# Patient Record
Sex: Female | Born: 1950 | Race: White | Hispanic: No | Marital: Married | Smoking: Never smoker
Health system: Southern US, Community
[De-identification: ages and names within clinical notes are randomized; demographics above are authoritative.]

## PROBLEM LIST (undated history)

## (undated) DIAGNOSIS — I10 Essential (primary) hypertension: Secondary | ICD-10-CM

## (undated) DIAGNOSIS — M719 Bursopathy, unspecified: Secondary | ICD-10-CM

## (undated) HISTORY — PX: BREAST ENHANCEMENT SURGERY: SHX7

## (undated) HISTORY — PX: GANGLION CYST EXCISION: SHX1691

## (undated) HISTORY — DX: Bursopathy, unspecified: M71.9

## (undated) HISTORY — DX: Essential (primary) hypertension: I10

## (undated) HISTORY — PX: TUBAL LIGATION: SHX77

## (undated) HISTORY — PX: LASIK: SHX215

## (undated) HISTORY — PX: OTHER SURGICAL HISTORY: SHX169

## (undated) HISTORY — PX: BREAST IMPLANT REMOVAL: SUR1101

---

## 1999-04-25 ENCOUNTER — Encounter: Admission: RE | Admit: 1999-04-25 | Discharge: 1999-04-25 | Payer: Self-pay | Admitting: Obstetrics and Gynecology

## 1999-04-25 ENCOUNTER — Encounter: Payer: Self-pay | Admitting: Obstetrics and Gynecology

## 2000-12-11 ENCOUNTER — Encounter: Admission: RE | Admit: 2000-12-11 | Discharge: 2000-12-11 | Payer: Self-pay | Admitting: Family Medicine

## 2000-12-11 ENCOUNTER — Encounter: Payer: Self-pay | Admitting: Family Medicine

## 2001-12-16 ENCOUNTER — Encounter: Admission: RE | Admit: 2001-12-16 | Discharge: 2001-12-16 | Payer: Self-pay | Admitting: Family Medicine

## 2001-12-16 ENCOUNTER — Encounter: Payer: Self-pay | Admitting: Family Medicine

## 2003-01-14 ENCOUNTER — Encounter: Payer: Self-pay | Admitting: Family Medicine

## 2003-01-14 ENCOUNTER — Encounter: Admission: RE | Admit: 2003-01-14 | Discharge: 2003-01-14 | Payer: Self-pay | Admitting: Family Medicine

## 2004-08-09 ENCOUNTER — Other Ambulatory Visit: Admission: RE | Admit: 2004-08-09 | Discharge: 2004-08-09 | Payer: Self-pay | Admitting: Dermatology

## 2010-07-22 ENCOUNTER — Encounter: Payer: Self-pay | Admitting: Family Medicine

## 2010-10-11 ENCOUNTER — Encounter (HOSPITAL_BASED_OUTPATIENT_CLINIC_OR_DEPARTMENT_OTHER): Payer: BC Managed Care – PPO | Admitting: Internal Medicine

## 2010-10-11 ENCOUNTER — Ambulatory Visit (HOSPITAL_COMMUNITY)
Admission: RE | Admit: 2010-10-11 | Discharge: 2010-10-11 | Disposition: A | Discharge status: Home / Self Care | Payer: BC Managed Care – PPO | Source: Ambulatory Visit | Attending: Internal Medicine | Admitting: Internal Medicine

## 2010-10-11 DIAGNOSIS — K644 Residual hemorrhoidal skin tags: Secondary | ICD-10-CM

## 2010-10-11 DIAGNOSIS — Z79899 Other long term (current) drug therapy: Secondary | ICD-10-CM | POA: Insufficient documentation

## 2010-10-11 DIAGNOSIS — Z1211 Encounter for screening for malignant neoplasm of colon: Secondary | ICD-10-CM | POA: Insufficient documentation

## 2010-10-11 DIAGNOSIS — I10 Essential (primary) hypertension: Secondary | ICD-10-CM | POA: Insufficient documentation

## 2010-10-11 DIAGNOSIS — Z800 Family history of malignant neoplasm of digestive organs: Secondary | ICD-10-CM

## 2010-11-05 NOTE — Op Note (Signed)
  NAMEJAYLEIGH, Hailey Kelly               ACCOUNT NO.:  [id number]  MEDICAL RECORD NO.:  [id number]           PATIENT TYPE:  O  LOCATION:  DAYP                          FACILITY:  [facility]  PHYSICIAN:  Lionel December, M.D.    DATE OF BIRTH:  05/14/1951  DATE OF PROCEDURE:  10/11/2010 DATE OF DISCHARGE:                              OPERATIVE REPORT   PROCEDURE:  Colonoscopy.  SURGEON:  Lionel December, MD  INDICATIONS:  Chrisa is a 60 year old Caucasian female who is undergoing average risk screening colonoscopy.  Family history is positive for colon carcinoma but only in one second degree relative, her paternal uncle, who was possibly older than 52.  Procedure risks were reviewed with the patient and informed consent was obtained.  MEDICATIONS FOR CONSCIOUS SEDATION:  Demerol 50 mg IV, Versed 5 mg IV.  FINDINGS:  Procedure performed in endoscopy suite.  The patient's vital signs and O2 sats were monitored during the procedure and remained stable.  The patient was placed in left lateral recumbent position. Rectal examination performed.  No abnormality noted on external or digital exam.  Pentax videoscope was placed through the rectum and advanced under vision into sigmoid colon beyond.  She had some formed stool in the proximal sigmoid colon but prep was otherwise excellent. Scope was passed into cecum which was identified by appendiceal orifice and ileocecal valve.  Pictures were taken for the record.  As the scope was withdrawn, colonic mucosa was carefully examined and no abnormalities were noted.  Rectal mucosa similarly was normal.  Scope was retroflexed to examine.  Anorectal junction and small hemorrhoids noted below the dentate line.  Endoscope was withdrawn.  Withdrawal time was 10 minutes.  The patient tolerated the procedure well.  FINAL DIAGNOSES: 1. Examination performed to cecum. 2. Small external hemorrhoids, otherwise normal colonoscopy.  RECOMMENDATIONS: 1. Standard  instructions given. 2. Yearly Hemoccults. 3. Consider next screening exam in 10 years.     Lionel December, M.D.     NR/MEDQ  D:  10/11/2010  T:  10/11/2010  Job:  [id number]  cc:   Peyton Najjar, MD  Electronically Signed by Lionel December M.D. on 11/05/2010 12:07:13 PM

## 2017-05-16 ENCOUNTER — Encounter: Payer: Self-pay | Admitting: Neurology

## 2017-05-28 ENCOUNTER — Encounter: Payer: Self-pay | Admitting: Neurology

## 2017-06-03 NOTE — Progress Notes (Signed)
Lawrence MarseillesDonna E Verrill was seen today in the movement disorders clinic for neurologic consultation at the request of Kirstie PeriShah, Ashish, MD.  This patient is accompanied in the office by her husband who supplements the history.  The consultation is for the evaluation of resting tremor on the L.  Started a few weeks ago per PCP notes that are reviewed.  Pt reports that it started in 2015.  Specific Symptoms:  Tremor: Yes.  , L hand, started in thumb in 2015 and then in 2016 involved then hand.  This year started in the lip.  Mostly at rest but sometimes when she holds something.   Nothing in the leg and nothing on the R side Family hx of similar:  No. Voice: no change Sleep: trouble getting and staying asleep  Vivid Dreams:  Yes.    Acting out dreams:  Yes.   - yells out - started about 5-6 years.  No falling out of the bed Wet Pillows: No. Postural symptoms:  "a little wobbly"  Falls? Fell down back steps in 2015 but steps were a little icy Bradykinesia symptoms: difficulty getting out of a chair Loss of smell:  Yes.   Loss of taste:  Yes.   Urinary Incontinence:  No. Difficulty Swallowing:  No. Handwriting, micrographia: No. Trouble with ADL's:  No.  Trouble buttoning clothing: No. Depression:  No. Memory changes:  No. Hallucinations:  No.  visual distortions: No. N/V:  No. Lightheaded:  No.  Syncope: No. Diplopia:  No. Dyskinesia:  No.  Neuroimaging of the brain has not previously been performed.   PREVIOUS MEDICATIONS: none to date  ALLERGIES:  No Known Allergies  CURRENT MEDICATIONS:  Outpatient Encounter Medications as of 06/05/2017  Medication Sig  . Biotin 1 MG CAPS Take by mouth daily.  . diphenhydrAMINE (BENADRYL) 25 MG tablet Take 25 mg by mouth as needed.  . Lutein 40 MG CAPS Take by mouth.  . Melatonin 10 MG CAPS Take by mouth as needed.  . metoprolol succinate (TOPROL-XL) 50 MG 24 hr tablet Take 50 mg by mouth daily. Take with or immediately following a meal.  .  Multiple Vitamins-Minerals (CENTRUM ADULTS PO) Take by mouth.  . naproxen sodium (ALEVE) 220 MG tablet Take 220 mg by mouth.  Marland Kitchen. tiZANidine (ZANAFLEX) 4 MG capsule Take 4 mg by mouth 3 (three) times daily as needed for muscle spasms.  . TURMERIC PO Take 500 mg by mouth 3 (three) times daily.   No facility-administered encounter medications on file as of 06/05/2017.     PAST MEDICAL HISTORY:   Past Medical History:  Diagnosis Date  . Bursitis    left shoulder  . Hypertension     PAST SURGICAL HISTORY:   Past Surgical History:  Procedure Laterality Date  . BREAST ENHANCEMENT SURGERY    . BREAST IMPLANT REMOVAL    . GANGLION CYST EXCISION    . LASIK    . TUBAL LIGATION      SOCIAL HISTORY:   Social History   Socioeconomic History  . Marital status: Married    Spouse name: Not on file  . Number of children: Not on file  . Years of education: Not on file  . Highest education level: Not on file  Social Needs  . Financial resource strain: Not on file  . Food insecurity - worry: Not on file  . Food insecurity - inability: Not on file  . Transportation needs - medical: Not on file  . Transportation needs -  non-medical: Not on file  Occupational History  . Not on file  Tobacco Use  . Smoking status: Never Smoker  . Smokeless tobacco: Never Used  Substance and Sexual Activity  . Alcohol use: Yes    Frequency: Never    Comment: beer 2-3 times a week  . Drug use: No  . Sexual activity: Not on file  Other Topics Concern  . Not on file  Social History Narrative  . Not on file    FAMILY HISTORY:   Family Status  Relation Name Status  . Mother  Alive  . Father  Deceased  . Sister  Deceased  . Brother  Alive    ROS:  A complete 10 system review of systems was obtained and was unremarkable apart from what is mentioned above.  PHYSICAL EXAMINATION:    VITALS:   Vitals:   06/05/17 0923  BP: (!) 144/88  Pulse: 84  SpO2: 95%  Weight: 128 lb (58.1 kg)  Height: 5'  4.5" (1.638 m)    GEN:  The patient appears stated age and is in NAD. HEENT:  Normocephalic, atraumatic.  The mucous membranes are moist. The superficial temporal arteries are without ropiness or tenderness. CV:  RRR Lungs:  CTAB Neck/HEME:  There are no carotid bruits bilaterally.  Neurological examination:  Orientation: The patient is alert and oriented x3. Fund of knowledge is appropriate.  Recent and remote memory are intact.  Attention and concentration are normal.    Able to name objects and repeat phrases. Cranial nerves: There is good facial symmetry. She has facial hypomimia.  Pupils are equal round and reactive to light bilaterally. Fundoscopic exam reveals clear margins bilaterally. Extraocular muscles are intact. The visual fields are full to confrontational testing. The speech is fluent and clear.  She is hypophonic.   Soft palate rises symmetrically and there is no tongue deviation. Hearing is intact to conversational tone. Sensation: Sensation is intact to light and pinprick throughout (facial, trunk, extremities). Vibration is intact at the bilateral big toe. There is no extinction with double simultaneous stimulation. There is no sensory dermatomal level identified. Motor: Strength is 5/5 in the bilateral upper and lower extremities.   Shoulder shrug is equal and symmetric.  There is no pronator drift. Deep tendon reflexes: Deep tendon reflexes are 2+-3-/4 at the bilateral biceps, triceps, brachioradialis, patella and achilles. Plantar responses are downgoing bilaterally.  Movement examination: Tone: There is normal tone in the bilateral upper extremities.  The tone in the lower extremities is normal.  Abnormal movements: There is L>RUE resting tremor that increases with distraction. Coordination:  There is decremation with RAM's, only with finger taps on the L (she is R handed).  All other RAMs, including alternating supination and pronation of the forearm, hand opening and  closing, heel taps and toe taps are normal bilaterally Gait and Station: The patient has no difficulty arising out of a deep-seated chair without the use of the hands. The patient's stride length is normal with good arm swing.  The patient has a negative pull test.      ASSESSMENT/PLAN:  1.  Idiopathic Parkinson's disease.  This is mild and a new diagnosis on June 05, 2017.  -We discussed the diagnosis as well as pathophysiology of the disease.  We discussed treatment options as well as prognostic indicators.  Patient education was provided.  -We discussed that it used to be thought that levodopa would increase risk of melanoma but now it is believed that Parkinsons itself  likely increases risk of melanoma. she is to get regular skin checks.  She is going to go to Dr. Orvan Falconer in [place] (her husbands dermatologist)  -Greater than 50% of the 60 minute visit was spent in counseling answering questions and talking about what to expect now as well as in the future.  We talked about medication options as well as potential future surgical options.  We talked about safety in the home.  -We decided to hold medication for now given that her disease is mild.  I did offer it, but ultimately we decided to hold for now.  -We talked about the importance of safe, cardiovascular exercise.  -We discussed community resources in the area including patient support groups and community exercise programs for PD and pt education was provided to the patient.  -I was going to do lab work but she states that she has her CPE on 06/13/17 and is to have labs then.  2.  I will plan on seeing her back in the next 6 months, sooner should new neurologic issues arise.     Cc:  Kirstie Peri, MD

## 2017-06-05 ENCOUNTER — Ambulatory Visit: Payer: Medicare Other | Admitting: Neurology

## 2017-06-05 ENCOUNTER — Encounter: Payer: Self-pay | Admitting: Neurology

## 2017-06-05 VITALS — BP 144/88 | HR 84 | Ht 64.5 in | Wt 128.0 lb

## 2017-06-05 DIAGNOSIS — G20 Parkinson's disease: Secondary | ICD-10-CM | POA: Diagnosis not present

## 2017-06-05 NOTE — Patient Instructions (Signed)
Try to work on a spin bike and work to a goal of 90 rpm

## 2017-12-02 NOTE — Progress Notes (Signed)
Hailey Kelly was seen today in the movement disorders clinic for neurologic consultation at the request of Kirstie Peri, MD.  This patient is accompanied in the office by her husband who supplements the history.  The consultation is for the evaluation of resting tremor on the L.  Started a few weeks ago per PCP notes that are reviewed.  Pt reports that it started in 2015.  12/04/17 update: Patient is seen today in follow-up for Parkinson's disease, which was diagnosed last visit.  She is on no medication.  Pt denies falls.  Pt denies lightheadedness, near syncope.  No hallucinations.  Mood has been good.  Tremor seems a bit better.    Last dermatology visit was 4/17 to Dr. Orvan Falconer and removed a precancerous mole.  She is riding spin bike - 30 min per day, 90 rpm.  She is also doing mobility CD 5 days a week for balance exercises.  PREVIOUS MEDICATIONS: none to date  ALLERGIES:  No Known Allergies  CURRENT MEDICATIONS:  Outpatient Encounter Medications as of 12/04/2017  Medication Sig  . diphenhydrAMINE (BENADRYL) 25 MG tablet Take 25 mg by mouth as needed.  . Lutein 40 MG CAPS Take by mouth.  . Melatonin 10 MG CAPS Take by mouth as needed.  . metoprolol succinate (TOPROL-XL) 50 MG 24 hr tablet Take 50 mg by mouth daily. Take with or immediately following a meal.  . Multiple Vitamins-Minerals (CENTRUM ADULTS PO) Take by mouth.  . naproxen sodium (ALEVE) 220 MG tablet Take 220 mg by mouth.  Marland Kitchen tiZANidine (ZANAFLEX) 4 MG capsule Take 4 mg by mouth 3 (three) times daily as needed for muscle spasms.  . TURMERIC PO Take 500 mg by mouth 3 (three) times daily.  . [DISCONTINUED] Biotin 1 MG CAPS Take by mouth daily.   No facility-administered encounter medications on file as of 12/04/2017.     PAST MEDICAL HISTORY:   Past Medical History:  Diagnosis Date  . Bursitis    left shoulder  . Hypertension     PAST SURGICAL HISTORY:   Past Surgical History:  Procedure Laterality Date  . BREAST  ENHANCEMENT SURGERY    . BREAST IMPLANT REMOVAL    . GANGLION CYST EXCISION    . LASIK    . TUBAL LIGATION      SOCIAL HISTORY:   Social History   Socioeconomic History  . Marital status: Married    Spouse name: Not on file  . Number of children: Not on file  . Years of education: Not on file  . Highest education level: Not on file  Occupational History  . 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 Financial resource strain: Not on file  . Food insecurity:    Worry: Not on file    Inability: Not on file  . Transportation needs:    Medical: Not on file    Non-medical: Not on file  Tobacco Use  . Smoking status: Never Smoker  . Smokeless tobacco: Never Used  Substance and Sexual Activity  . Alcohol use: Yes    Frequency: Never    Comment: beer 2-3 times a week  . Drug use: No  . Sexual activity: Not on file  Lifestyle  . Physical activity:    Days per week: Not on file    Minutes per session: Not on file  . Stress: Not on file  Relationships  . Social connections:  Talks on phone: Not on file    Gets together: Not on file    Attends religious service: Not on file    Active member of club or organization: Not on file    Attends meetings of clubs or organizations: Not on file    Relationship status: Not on file  . Intimate partner violence:    Fear of current or ex partner: Not on file    Emotionally abused: Not on file    Physically abused: Not on file    Forced sexual activity: Not on file  Other Topics Concern  . Not on file  Social History Narrative  . Not on file    FAMILY HISTORY:   Family Status  Relation Name Status  . Mother  Alive  . Father  Deceased  . Sister  Deceased  . Brother  Alive    ROS:  A complete 10 system review of systems was obtained and was unremarkable apart from what is mentioned above.  PHYSICAL EXAMINATION:    VITALS:   Vitals:   12/04/17 1022  BP: 130/76  Pulse:  88  SpO2: 95%  Weight: 123 lb (55.8 kg)  Height: 5' 4.5" (1.638 m)   GEN:  The patient appears stated age and is in NAD. HEENT:  Normocephalic, atraumatic.  The mucous membranes are moist. The superficial temporal arteries are without ropiness or tenderness. CV:  RRR Lungs:  CTAB Neck/HEME:  There are no carotid bruits bilaterally.  Neurological examination:  Orientation: The patient is alert and oriented x3. Cranial nerves: There is good facial symmetry. There is facial hypomimia.  The speech is fluent and clear. Soft palate rises symmetrically and there is no tongue deviation. Hearing is intact to conversational tone. Sensation: Sensation is intact to light touch throughout Motor: Strength is 5/5 in the bilateral upper and lower extremities.   Shoulder shrug is equal and symmetric.  There is no pronator drift.  Movement examination: Tone: There is normal tone in the upper and lower cavities.   Abnormal movements: There is left upper extremity resting tremor.  None seen on the right today. Coordination:  There is  decremation with RAM's, only with finger taps on the left.  All other rapid alternating movements were normal. Gait and Station: The patient has no difficulty arising out of a deep-seated chair without the use of the hands. The patient's stride length is normal with good arm swing.  The patient has a negative pull test.      Labs: Lab work has been reviewed.  Lab work is done June 18, 2017.  White blood cells were 4.4, hemoglobin 12.8, hematocrit 38.8 and platelets 233.  TSH was normal at 2.360.  Sodium was 142, potassium 4.4, chloride 109, CO2 22, BUN 21, creatinine 0.80.  Glucose was 108.  AST 26, ALT 27.   ASSESSMENT/PLAN:  1.  Idiopathic Parkinson's disease.  This is mild and a new diagnosis on June 05, 2017.  -We discussed the diagnosis as well as pathophysiology of the disease.  We discussed treatment options as well as prognostic indicators.  Patient education  was provided.  -We discussed that it used to be thought that levodopa would increase risk of melanoma but now it is believed that Parkinsons itself likely increases risk of melanoma. she is to get regular skin checks.  She is going to go to Dr. Orvan Falconer in [place] (her husbands dermatologist)  -Patient is doing well off of medication and would like to stay off medication  for now.  -Congratulated the patient on all of the exercises that she is doing.  I am very proud of her.  She is to continue this.  Discussed data on exercise and Parkinson's disease.  -Invited to Parkinson's symposium.  2.  Follow-up in 6 months.   Cc:  Kirstie PeriShah, Ashish, MD

## 2017-12-04 ENCOUNTER — Ambulatory Visit: Payer: Medicare Other | Admitting: Neurology

## 2017-12-04 ENCOUNTER — Encounter: Payer: Self-pay | Admitting: Neurology

## 2017-12-04 VITALS — BP 130/76 | HR 88 | Ht 64.5 in | Wt 123.0 lb

## 2017-12-04 DIAGNOSIS — G20 Parkinson's disease: Secondary | ICD-10-CM | POA: Diagnosis not present

## 2017-12-04 NOTE — Patient Instructions (Signed)
Registration is OPEN!    Third Annual Parkinson's Education Symposium   To register: [email or web address]      Search:  Parkinson's Symposium  Register EACH person attending individually Questions: Contact Jessica Thomas, LCSW  [phone] or [email or web address]    [facility] Ad[facility] with Parkinson's Thursdays 9:30-10:30 am May 2, May 9, & May 16 & Tuesdays 10:00-11:00 am June 4, June 18, & July 2 [facility], [address] To register: [phone] or email [email or web address]        

## 2018-06-03 NOTE — Progress Notes (Signed)
Hailey Kelly was seen today in the movement disorders clinic for neurologic consultation at the request of Monico Blitz, MD.  This patient is accompanied in the office by her husband who supplements the history.  The consultation is for the evaluation of resting tremor on the L.  Started a few weeks ago per PCP notes that are reviewed.  Pt reports that it started in 2015.  12/04/17 update: Patient is seen today in follow-up for Parkinson's disease, which was diagnosed last visit.  She is on no medication.  Pt denies falls.  Pt denies lightheadedness, near syncope.  No hallucinations.  Mood has been good.  Tremor seems a bit better.    Last dermatology visit was 4/17 to Dr. Tarri Glenn and removed a precancerous mole.  She is riding spin bike - 30 min per day, 90 rpm.  She is also doing mobility CD 5 days a week for balance exercises.  06/05/18 update: Patient is seen today in follow-up for Parkinson's disease.  This was diagnosed about a year ago.  She is on no medication, by her choice.  She has had no falls since last visit.  No lightheadedness or near syncope.  She is still riding a spin bike 30 min per day, 90 min per day.  She is also doing tai chi 5 days per week.  She had her wellness visit in June.  She has dermatology in April.    PREVIOUS MEDICATIONS: none to date  ALLERGIES:  No Known Allergies  CURRENT MEDICATIONS:  Outpatient Encounter Medications as of 06/05/2018  Medication Sig  . diphenhydrAMINE (BENADRYL) 25 MG tablet Take 25 mg by mouth as needed.  . Lutein 40 MG CAPS Take by mouth.  . Melatonin 10 MG CAPS Take by mouth as needed.  . metoprolol succinate (TOPROL-XL) 50 MG 24 hr tablet Take 50 mg by mouth daily. Take with or immediately following a meal.  . Multiple Vitamins-Minerals (CENTRUM ADULTS PO) Take by mouth.  . naproxen sodium (ALEVE) 220 MG tablet Take 220 mg by mouth.  Marland Kitchen tiZANidine (ZANAFLEX) 4 MG capsule Take 4 mg by mouth 3 (three) times daily as needed for muscle  spasms.  . TURMERIC PO Take 500 mg by mouth 3 (three) times daily.   No facility-administered encounter medications on file as of 06/05/2018.     PAST MEDICAL HISTORY:   Past Medical History:  Diagnosis Date  . Bursitis    left shoulder  . Hypertension     PAST SURGICAL HISTORY:   Past Surgical History:  Procedure Laterality Date  . BREAST ENHANCEMENT SURGERY    . BREAST IMPLANT REMOVAL    . GANGLION CYST EXCISION    . LASIK    . TUBAL LIGATION      SOCIAL HISTORY:   Social History   Socioeconomic History  . Marital status: Married    Spouse name: Not on file  . Number of children: Not on file  . Years of education: Not on file  . Highest education level: Not on file  Occupational History  . 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 Financial resource strain: Not on file  . Food insecurity:    Worry: Not on file    Inability: Not on file  . Transportation needs:    Medical: Not on file    Non-medical: Not on file  Tobacco Use  . Smoking status: Never Smoker  . Smokeless  tobacco: Never Used  Substance and Sexual Activity  . Alcohol use: Yes    Frequency: Never    Comment: beer 2-3 times a week  . Drug use: No  . Sexual activity: Not on file  Lifestyle  . Physical activity:    Days per week: Not on file    Minutes per session: Not on file  . Stress: Not on file  Relationships  . Social connections:    Talks on phone: Not on file    Gets together: Not on file    Attends religious service: Not on file    Active member of club or organization: Not on file    Attends meetings of clubs or organizations: Not on file    Relationship status: Not on file  . Intimate partner violence:    Fear of current or ex partner: Not on file    Emotionally abused: Not on file    Physically abused: Not on file    Forced sexual activity: Not on file  Other Topics Concern  . Not on file  Social History Narrative  . Not  on file    FAMILY HISTORY:   Family Status  Relation Name Status  . Mother  Alive  . Father  Deceased  . Sister  Deceased  . Brother  Alive    ROS:  Review of Systems  Constitutional: Negative.   HENT: Negative.   Eyes: Negative.   Respiratory: Negative.   Cardiovascular: Negative.   Gastrointestinal: Negative.   Genitourinary: Negative.   Musculoskeletal: Negative.   Skin: Negative.   Endo/Heme/Allergies: Negative.     PHYSICAL EXAMINATION:    VITALS:   Vitals:   06/05/18 1001  BP: 122/72  Pulse: 82  SpO2: 97%  Weight: 114 lb (51.7 kg)  Height: 5' 4.5" (1.638 m)   GEN:  The patient appears stated age and is in NAD. HEENT:  Normocephalic, atraumatic.  The mucous membranes are moist. The superficial temporal arteries are without ropiness or tenderness. CV:  RRR Lungs:  CTAB Neck/HEME:  There are no carotid bruits bilaterally.  Neurological examination:  Orientation: The patient is alert and oriented x3. Cranial nerves: There is good facial symmetry. The speech is fluent and clear. Soft palate rises symmetrically and there is no tongue deviation. Hearing is intact to conversational tone. Sensation: Sensation is intact to light touch throughout Motor: Strength is 5/5 in the bilateral upper and lower extremities.   Shoulder shrug is equal and symmetric.  There is no pronator drift.  Movement examination: Tone: There is mild increased tone in the LUE/LLE Abnormal movements: There is left upper extremity resting tremor.  None seen on the right today. Coordination:  There is  decremation with RAM's, only with finger taps on the left.  All other rapid alternating movements were normal. Gait and Station: The patient has no difficulty arising out of a deep-seated chair without the use of the hands. The patient's stride length is normal with good arm swing.  The patient has a negative pull test.      Labs: Lab work has been reviewed.  Lab work is done June 18, 2017.   White blood cells were 4.4, hemoglobin 12.8, hematocrit 38.8 and platelets 233.  TSH was normal at 2.360.  Sodium was 142, potassium 4.4, chloride 109, CO2 22, BUN 21, creatinine 0.80.  Glucose was 108.  AST 26, ALT 27.   ASSESSMENT/PLAN:  1.  Idiopathic Parkinson's disease.  This is mild and a new diagnosis  on June 05, 2017.  -We discussed the diagnosis as well as pathophysiology of the disease.  We discussed treatment options as well as prognostic indicators.  Patient education was provided.  -We discussed that it used to be thought that levodopa would increase risk of melanoma but now it is believed that Parkinsons itself likely increases risk of melanoma. she is to get regular skin checks.  She has an appointment with dermatology in April  -I discussed with the patient that I do think it is time for medication.  She wants to hold off on medication for 1 more visit, but we did discuss that we will likely add medication next visit.  This is the first visit where I have seen her more rigid.  We discussed why medication would be important now that she is developing more rigidity.  She was agreeable to this approach.  -I congratulated her on all of the exercise.  She is really taking and all in approach and I am proud of her.  -Patient reports that she has lab work upcoming at her annual wellness exam.  We will try to get a copy of that before next visit.   2.  I will see the patient back in the next 5 months.  Much greater than 50% of this visit was spent in counseling and coordinating care.  Total face to face time:  25 min   Cc:  Monico Blitz, MD

## 2018-06-05 ENCOUNTER — Ambulatory Visit: Payer: Medicare Other | Admitting: Neurology

## 2018-06-05 ENCOUNTER — Encounter: Payer: Self-pay | Admitting: Neurology

## 2018-06-05 VITALS — BP 122/72 | HR 82 | Ht 64.5 in | Wt 114.0 lb

## 2018-06-05 DIAGNOSIS — G20 Parkinson's disease: Secondary | ICD-10-CM

## 2018-11-06 ENCOUNTER — Ambulatory Visit: Payer: Medicare Other | Admitting: Neurology

## 2019-03-04 NOTE — Progress Notes (Signed)
Virtual Visit via Telephone Note The purpose of this virtual visit is to provide medical care while limiting exposure to the novel coronavirus.    Consent was obtained for phone visit:  Yes.   Answered questions that patient had about telehealth interaction:  Yes.   I discussed the limitations, risks, security and privacy concerns of performing an evaluation and management service by telephone. I also discussed with the patient that there may be a patient responsible charge related to this service. The patient expressed understanding and agreed to proceed.  Pt location: Home Physician Location: home Name of referring provider:  Monico Blitz, MD I connected with .Hailey Kelly at patients initiation/request on 03/05/2019 at  8:15 AM EDT by telephone and verified that I am speaking with the correct person using two identifiers.  Pt MRN:  [id number] Pt DOB:  August 27, 1950   History of Present Illness:  Patient seen today in follow-up for Parkinson's disease.  Have not seen her since December, 2019.  She reports that she has had no falls since last visit.  "I have been doing pretty good."  No lightheadedness or near syncope.  No hallucinations. She is on no medication, by her choice.  She states that she is still active.  She is still riding her spin bike.  Was having trouble in march with fatigue and maintaining speed but that seems to be some better.  Saw pcp in June and no new medical problems.  Last dermatology visit in April, 2020   Observations/Objective:   Vitals:   03/05/19 0756  Weight: 112 lb (50.8 kg)  Height: 5\' 4"  (1.626 m)     Assessment and Plan:   1.  Parkinson's disease, diagnosed December, 2018  -Was not able to physically see the patient today, but last visit I told the patient that I thought it was time for medication and she had declined at that visit.  She states today that she is ready to start medication.  Decided to start carbidopa/levodopa 25/100.  Following  titration schedule given and pt repeated it back over the phone:  Take 1/2 tablet three times daily, at least 30 minutes before meals, for one week  Then take 1/2 tablet in the morning, 1/2 tablet in the afternoon, 1 tablet in the evening, at least 30 minutes before meals, for one week  Then take 1/2 tablet in the morning, 1 tablet in the afternoon, 1 tablet in the evening, at least 30 minutes before meals, for one week  Then take 1 tablet three times daily, at least 30 minutes before meals.  Told to take at 8am/noon/4pm   As a reminder, carbidopa/levodopa can be taken at the same time as a carbohydrate, but we like to have you take your pill either 30 minutes before a protein source or 1 hour after as protein can interfere with carbidopa/levodopa absorption.  -have not received labs from PCP.  Will try to get a copy.  -We discussed that it used to be thought that levodopa would increase risk of melanoma but now it is believed that Parkinsons itself likely increases risk of melanoma. she is to get regular skin checks.  Last derm visit in 09/2018  Follow Up Instructions:  4-6 months  -I discussed the assessment and treatment plan with the patient. The patient was provided an opportunity to ask questions and all were answered. The patient agreed with the plan and demonstrated an understanding of the instructions.   The patient was advised to  call back or seek an in-person evaluation if the symptoms worsen or if the condition fails to improve as anticipated.    Total Time spent in visit with the patient was:  13 min, of which 100% of the time was spent in counseling.   Pt understands and agrees with the plan of care outlined.     Kerin Salenebecca Chancie Lampert, DO

## 2019-03-05 ENCOUNTER — Other Ambulatory Visit: Payer: Self-pay

## 2019-03-05 ENCOUNTER — Telehealth: Payer: Self-pay

## 2019-03-05 ENCOUNTER — Telehealth (INDEPENDENT_AMBULATORY_CARE_PROVIDER_SITE_OTHER): Payer: Medicare Other | Admitting: Neurology

## 2019-03-05 ENCOUNTER — Encounter

## 2019-03-05 VITALS — Ht 64.0 in | Wt 112.0 lb

## 2019-03-05 DIAGNOSIS — G20 Parkinson's disease: Secondary | ICD-10-CM | POA: Diagnosis not present

## 2019-03-05 MED ORDER — CARBIDOPA-LEVODOPA 25-100 MG PO TABS: 1.0000 | ORAL_TABLET | Freq: Three times a day (TID) | ORAL | 1 refills | Status: DC

## 2019-03-05 NOTE — Telephone Encounter (Signed)
Faxed request to Dr. Jacqulyn Bath in [place] at [phone]

## 2019-03-05 NOTE — Telephone Encounter (Signed)
-----   Message from Dexter, DO sent at 03/05/2019  8:25 AM EDT ----- Please request labs from PCP office

## 2019-06-28 ENCOUNTER — Other Ambulatory Visit: Payer: Self-pay | Admitting: Neurology

## 2019-08-03 ENCOUNTER — Telehealth: Payer: Self-pay | Admitting: Neurology

## 2019-08-03 ENCOUNTER — Other Ambulatory Visit: Payer: Self-pay

## 2019-08-03 MED ORDER — CARBIDOPA-LEVODOPA 25-100 MG PO TABS: 1.0000 | ORAL_TABLET | Freq: Three times a day (TID) | ORAL | 3 refills | Status: DC

## 2019-08-03 NOTE — Telephone Encounter (Signed)
A year supply was sent to [facility] in December so called patient and she is calling to cancel, sent new RX to [facility]

## 2019-08-03 NOTE — Telephone Encounter (Signed)
Patient was wanting to know if her medication was called into the [facility]- Carbidopa Levodopa 25-100 mg. Insurance changed and she is now using the [employer] with new ins. Thanks!

## 2019-08-18 ENCOUNTER — Ambulatory Visit: Payer: Medicare Other | Admitting: Neurology

## 2019-08-20 NOTE — Progress Notes (Signed)
Hailey Kelly was seen today in follow up for Parkinsons disease. No SE with the medication.   My previous records were reviewed prior to todays visit. Pt denies falls.  Pt denies lightheadedness, near syncope.  No hallucinations.  Mood has been good.  Riding bike and doing tai chi for exercise.  Having constipation.  Using miralax.  Current prescribed movement disorder medications: Carbidopa/levodopa 25/100, 1 tablet 3 times per day (started last visit)   PREVIOUS MEDICATIONS: Sinemet  ALLERGIES:  No Known Allergies  CURRENT MEDICATIONS:  Outpatient Encounter Medications as of 08/23/2019  Medication Sig  . carbidopa-levodopa (SINEMET IR) 25-100 MG tablet Take 1 tablet by mouth 3 (three) times daily.  . diphenhydrAMINE (BENADRYL) 25 MG tablet Take 25 mg by mouth as needed.  . Melatonin 10 MG CAPS Take by mouth as needed.  . metoprolol succinate (TOPROL-XL) 50 MG 24 hr tablet Take 50 mg by mouth daily. Take with or immediately following a meal.  . naproxen sodium (ALEVE) 220 MG tablet Take 220 mg by mouth.  Marland Kitchen tiZANidine (ZANAFLEX) 4 MG capsule Take 4 mg by mouth 3 (three) times daily as needed for muscle spasms.   No facility-administered encounter medications on file as of 08/23/2019.    PHYSICAL EXAMINATION:    VITALS:   Vitals:   08/23/19 1034  BP: (!) 156/82  Pulse: 69  SpO2: 98%  Weight: 111 lb (50.3 kg)  Height: 5' 4.5" (1.638 m)    GEN:  The patient appears stated age and is in NAD. HEENT:  Normocephalic, atraumatic.  The mucous membranes are moist. The superficial temporal arteries are without ropiness or tenderness. CV:  RRR Lungs:  CTAB Neck/HEME:  There are no carotid bruits bilaterally.  Neurological examination:  Orientation: The patient is alert and oriented x3. Cranial nerves: There is good facial symmetry without facial hypomimia. The speech is fluent and clear. Soft palate rises symmetrically and there is no tongue deviation. Hearing is intact to  conversational tone. Sensation: Sensation is intact to light touch throughout Motor: Strength is at least antigravity x4.  Movement examination: Tone: There is normal tone in the UE?LE Abnormal movements: none Coordination:  There is min decremation with RAM's, only with finger taps on the L.  All other RAMs are nl Gait and Station: The patient has no difficulty arising out of a deep-seated chair without the use of the hands. The patient's stride length is good with good arm swing bilaterally.    Labs: -Did call and request lab work from her primary care physician.  Lab work was never received.   ASSESSMENT/PLAN:  1.  Parkinson's disease, diagnosed in 2018, starting levodopa in September, 2020  -Continue carbidopa/levodopa 25/100, 1 tablet 3 times per day.  -Exercise (she is)  -We discussed that it used to be thought that levodopa would increase risk of melanoma but now it is believed that Parkinsons itself likely increases risk of melanoma. she is to get regular skin checks.  -discussed covid vaccine.  Recommended for her  2.  Constipation  -discussed nature and pathophysiology and association with PD  -discussed importance of hydration.  Pt is to increase water intake  -pt is given a copy of the rancho recipe  -recommended daily colace  -recommended miralax prn    Total time spent on today's visit was 25 minutes, including both face-to-face time and nonface-to-face time.  Time included that spent on review of records (prior notes available to me/labs/imaging if pertinent), discussing treatment and  goals, answering patient's questions and coordinating care.  Cc:  Monico Blitz, MD

## 2019-08-23 ENCOUNTER — Other Ambulatory Visit: Payer: Self-pay

## 2019-08-23 ENCOUNTER — Ambulatory Visit: Payer: Medicare PPO | Admitting: Neurology

## 2019-08-23 ENCOUNTER — Encounter: Payer: Self-pay | Admitting: Neurology

## 2019-08-23 VITALS — BP 156/82 | HR 69 | Ht 64.5 in | Wt 111.0 lb

## 2019-08-23 DIAGNOSIS — G20 Parkinson's disease: Secondary | ICD-10-CM | POA: Diagnosis not present

## 2019-08-23 DIAGNOSIS — K5901 Slow transit constipation: Secondary | ICD-10-CM

## 2019-08-23 NOTE — Patient Instructions (Signed)
Constipation and Parkinson's disease:  1.Rancho recipe for constipation in Parkinsons Disease:  -1 cup of unprocessed bran (need to get this at [employer], [employer] or similar type of store), 2 cups of applesauce in 1 cup of prune juice 2.  Increase fiber intake (Metamucil,vegetables) 3.  Regular, moderate exercise can be beneficial. 4.  Avoid medications causing constipation, such as medications like antacids with calcium or magnesium 5.  It's okay to take daily Miralax, and taper if stools become too loose or you experience diarrhea 6.  Stool softeners (Colace) can help with chronic constipation and I recommend you take this daily. 7.  Increase water intake.  You should be drinking 1/2 gallon of water a day as long as you have not been diagnosed with congestive heart failure or renal/kidney failure.  This is probably the single greatest thing that you can do to help your constipation.  

## 2019-12-21 NOTE — Progress Notes (Signed)
    Assessment/Plan:   1.  Parkinsons Disease  -continue carbidopa/levodopa 25/100 tid  2.  RBD and insomnia  -discussed klonopin.  R/B/SE were discussed  Decided to hold for now  -increase melatonin to 3-9 mg  -avoid benadryl b/c of anticholinergic properties.  3.  Follow up is anticipated in the next 4-6 months, sooner should new neurologic issues arise.   Subjective:   Hailey Kelly was seen today in follow up for Parkinsons disease.  My previous records were reviewed prior to todays visit as well as outside records available to me. Pt denies falls.  Pt denies lightheadedness, near syncope.  No hallucinations.  Mood has been good.  Some trouble sleeping.  Tried benadryl and melatonin without good relief.  Doesn't want more med  Current prescribed movement disorder medications: carbidopa/levodopa 25/100 tid  ALLERGIES:  No Known Allergies  CURRENT MEDICATIONS:  Outpatient Encounter Medications as of 12/23/2019  Medication Sig  . carbidopa-levodopa (SINEMET IR) 25-100 MG tablet Take 1 tablet by mouth 3 (three) times daily.  . diphenhydrAMINE (BENADRYL) 25 MG tablet Take 25 mg by mouth as needed.  . Melatonin 10 MG CAPS Take by mouth as needed.  . metoprolol succinate (TOPROL-XL) 50 MG 24 hr tablet Take 50 mg by mouth daily. Take with or immediately following a meal.  . naproxen sodium (ALEVE) 220 MG tablet Take 220 mg by mouth.  Marland Kitchen tiZANidine (ZANAFLEX) 4 MG capsule Take 4 mg by mouth 3 (three) times daily as needed for muscle spasms.   No facility-administered encounter medications on file as of 12/23/2019.    Objective:   PHYSICAL EXAMINATION:    VITALS:   Vitals:   12/23/19 1038  BP: (!) 148/84  Pulse: 66  SpO2: 97%  Weight: 108 lb (49 kg)  Height: 5' 4.5" (1.638 m)    GEN:  The patient appears stated age and is in NAD. HEENT:  Normocephalic, atraumatic.  The mucous membranes are moist. The superficial temporal arteries are without ropiness or tenderness. CV:   RRR Lungs:  CTAB Neck/HEME:  There are no carotid bruits bilaterally.  Neurological examination:  Orientation: The patient is alert and oriented x3. Cranial nerves: There is good facial symmetry with min facial hypomimia. The speech is fluent and clear. Soft palate rises symmetrically and there is no tongue deviation. Hearing is intact to conversational tone. Sensation: Sensation is intact to light touch throughout Motor: Strength is at least antigravity x4.  Movement examination: Tone: There is normal tone in the UE/LE Abnormal movements: rare tremor in the RUE Coordination:  There is no decremation with RAM's, with any form of RAMS, including alternating supination and pronation of the forearm, hand opening and closing, finger taps, heel taps and toe taps. Gait and Station: The patient has no difficulty arising out of a deep-seated chair without the use of the hands. The patient's stride length is normal.      Total time spent on today's visit was 25 minutes, including both face-to-face time and nonface-to-face time.  Time included that spent on review of records (prior notes available to me/labs/imaging if pertinent), discussing treatment and goals, answering patient's questions and coordinating care.  Cc:  Kirstie Peri, MD

## 2019-12-23 ENCOUNTER — Encounter: Payer: Self-pay | Admitting: Neurology

## 2019-12-23 ENCOUNTER — Ambulatory Visit: Payer: Medicare PPO | Admitting: Neurology

## 2019-12-23 ENCOUNTER — Other Ambulatory Visit: Payer: Self-pay

## 2019-12-23 VITALS — BP 148/84 | HR 66 | Ht 64.5 in | Wt 108.0 lb

## 2019-12-23 DIAGNOSIS — G20 Parkinson's disease: Secondary | ICD-10-CM | POA: Diagnosis not present

## 2019-12-23 NOTE — Patient Instructions (Signed)
1.  increase melatonin to 3-9 mg 2.  avoid benadryl b/c of potential loss of balance 3.  You look great!  The physicians and staff at [facility] are committed to providing excellent care. You may receive a survey requesting feedback about your experience at our office. We strive to receive "very good" responses to the survey questions. If you feel that your experience would prevent you from giving the office a "very good " response, please contact our office to try to remedy the situation. We may be reached at [phone]. Thank you for taking the time out of your busy day to complete the survey.

## 2020-03-10 DIAGNOSIS — Z1231 Encounter for screening mammogram for malignant neoplasm of breast: Secondary | ICD-10-CM | POA: Diagnosis not present

## 2020-04-06 ENCOUNTER — Other Ambulatory Visit: Payer: Self-pay | Admitting: Neurology

## 2020-04-06 NOTE — Telephone Encounter (Signed)
Patient needs a refill on the medication Carbidopa levodopa  She uses the [facility] mail order   She gets a 90 day supply

## 2020-04-07 MED ORDER — CARBIDOPA-LEVODOPA 25-100 MG PO TABS: 1.0000 | ORAL_TABLET | Freq: Three times a day (TID) | ORAL | 1 refills | Status: DC

## 2020-04-07 NOTE — Telephone Encounter (Signed)
Rx(s) sent to pharmacy electronically.  

## 2020-06-28 DIAGNOSIS — Z299 Encounter for prophylactic measures, unspecified: Secondary | ICD-10-CM | POA: Diagnosis not present

## 2020-06-28 DIAGNOSIS — G20 Parkinson's disease: Secondary | ICD-10-CM | POA: Diagnosis not present

## 2020-06-28 DIAGNOSIS — Z681 Body mass index (BMI) 19 or less, adult: Secondary | ICD-10-CM | POA: Diagnosis not present

## 2020-06-28 DIAGNOSIS — Z23 Encounter for immunization: Secondary | ICD-10-CM | POA: Diagnosis not present

## 2020-06-28 DIAGNOSIS — I10 Essential (primary) hypertension: Secondary | ICD-10-CM | POA: Diagnosis not present

## 2020-06-28 DIAGNOSIS — Z713 Dietary counseling and surveillance: Secondary | ICD-10-CM | POA: Diagnosis not present

## 2020-07-03 NOTE — Progress Notes (Signed)
Assessment/Plan:   1.  Parkinsons Disease  -Continue carbidopa/levodopa 25/100, 1 tablet 3 times per day  2.  RBD/insomnia  -Have discussed clonazepam.  She does not wish to try for right now.  -On melatonin without success.  -start trazodone 50 mg.  R/B/SE were discussed.  The opportunity to ask questions was given and they were answered to the best of my ability.  The patient expressed understanding and willingness to follow the outlined treatment protocols.  3.  Fuchs dystrophy  -following with ophthalmology  4.  Sciatica  -we will do MRI lumbar spine.  Takes aleve without success.    -If neg, will try PT at [facility].  She didn't want to do this first as already doing tai chi almost daily and she didn't think PT was going to offer much different Subjective:   Hailey Kelly was seen today in follow up for Parkinsons disease.  My previous records were reviewed prior to todays visit as well as outside records available to me. Pt denies falls.  She is riding spin bike and doing tai chi for exercise.  Pt denies lightheadedness, near syncope.  No hallucinations.  Mood has been good.  She is having trouble sleeping despite trying melatonin.  No acting out of the dreams.  Is having pain/paresthesias down the L>R leg when standing since July.  Not a lot of back pain with it.    Current prescribed movement disorder medications: carbidopa/levodopa 25/100, 1 tablet 3 times per day    ALLERGIES:  No Known Allergies  CURRENT MEDICATIONS:  Outpatient Encounter Medications as of 07/05/2020  Medication Sig  . carbidopa-levodopa (SINEMET IR) 25-100 MG tablet Take 1 tablet by mouth 3 (three) times daily.  . metoprolol succinate (TOPROL-XL) 50 MG 24 hr tablet Take 50 mg by mouth daily. Take with or immediately following a meal.  . naproxen sodium (ALEVE) 220 MG tablet Take 220 mg by mouth as needed.  Marland Kitchen tiZANidine (ZANAFLEX) 4 MG capsule Take 4 mg by mouth 3 (three) times daily as needed for  muscle spasms.  . traZODone (DESYREL) 50 MG tablet Take 1 tablet (50 mg total) by mouth at bedtime.  . [DISCONTINUED] diphenhydrAMINE (BENADRYL) 25 MG tablet Take 25 mg by mouth as needed. (Patient not taking: Reported on 07/05/2020)  . [DISCONTINUED] Melatonin 10 MG CAPS Take by mouth as needed. (Patient not taking: Reported on 07/05/2020)   No facility-administered encounter medications on file as of 07/05/2020.    Objective:   PHYSICAL EXAMINATION:    VITALS:   Vitals:   07/05/20 0920  BP: 124/72  Pulse: 70  SpO2: 99%  Weight: 108 lb (49 kg)  Height: 5' 4.5" (1.638 m)    GEN:  The patient appears stated age and is in NAD. HEENT:  Normocephalic, atraumatic.  The mucous membranes are moist. The superficial temporal arteries are without ropiness or tenderness. CV:  RRR Lungs:  CTAB Neck/HEME:  There are no carotid bruits bilaterally.  Neurological examination:  Orientation: The patient is alert and oriented x3. Cranial nerves: There is good facial symmetry without facial hypomimia. The speech is fluent and clear. Soft palate rises symmetrically and there is no tongue deviation. Hearing is intact to conversational tone. Sensation: Sensation is intact to light touch throughout Motor: Strength is at least antigravity x4.  Movement examination: Tone: There is nl tone in the ue/le Abnormal movements: none Coordination:  There is no decremation with RAM's, with any form of RAMS, including alternating supination  and pronation of the forearm, hand opening and closing, finger taps, heel taps and toe taps. Gait and Station: The patient has no difficulty arising out of a deep-seated chair without the use of the hands. The patient's stride length is normal.     Total time spent on today's visit was 30 minutes, including both face-to-face time and nonface-to-face time.  Time included that spent on review of records (prior notes available to me/labs/imaging if pertinent), discussing treatment  and goals, answering patient's questions and coordinating care.  Cc:  Monico Blitz, MD

## 2020-07-05 ENCOUNTER — Encounter: Payer: Self-pay | Admitting: Neurology

## 2020-07-05 ENCOUNTER — Ambulatory Visit: Payer: Medicare PPO | Admitting: Neurology

## 2020-07-05 ENCOUNTER — Other Ambulatory Visit: Payer: Self-pay

## 2020-07-05 VITALS — BP 124/72 | HR 70 | Ht 64.5 in | Wt 108.0 lb

## 2020-07-05 DIAGNOSIS — G20 Parkinson's disease: Secondary | ICD-10-CM | POA: Diagnosis not present

## 2020-07-05 DIAGNOSIS — M5416 Radiculopathy, lumbar region: Secondary | ICD-10-CM | POA: Diagnosis not present

## 2020-07-05 MED ORDER — TRAZODONE HCL 50 MG PO TABS: 50.0000 mg | ORAL_TABLET | Freq: Every day | ORAL | 1 refills | Status: DC

## 2020-07-05 NOTE — Patient Instructions (Signed)
1.  We will schedule MRI lumbar spine at [facility].  They will call you to schedule 2.  Start trazodone 50 mg at bedtime  The physicians and staff at [facility] are committed to providing excellent care. You may receive a survey requesting feedback about your experience at our office. We strive to receive "very good" responses to the survey questions. If you feel that your experience would prevent you from giving the office a "very good " response, please contact our office to try to remedy the situation. We may be reached at [phone]. Thank you for taking the time out of your busy day to complete the survey.

## 2020-07-20 ENCOUNTER — Ambulatory Visit (HOSPITAL_COMMUNITY): Payer: Medicare PPO

## 2020-07-31 ENCOUNTER — Ambulatory Visit (HOSPITAL_COMMUNITY)
Admission: RE | Admit: 2020-07-31 | Discharge: 2020-07-31 | Disposition: A | Discharge status: Home / Self Care | Payer: Medicare PPO | Source: Ambulatory Visit | Attending: Neurology | Admitting: Neurology

## 2020-07-31 ENCOUNTER — Other Ambulatory Visit: Payer: Self-pay

## 2020-07-31 DIAGNOSIS — M5416 Radiculopathy, lumbar region: Secondary | ICD-10-CM | POA: Diagnosis not present

## 2020-07-31 DIAGNOSIS — M545 Low back pain, unspecified: Secondary | ICD-10-CM | POA: Diagnosis not present

## 2020-08-02 ENCOUNTER — Other Ambulatory Visit: Payer: Self-pay

## 2020-08-02 DIAGNOSIS — M5416 Radiculopathy, lumbar region: Secondary | ICD-10-CM

## 2020-09-13 DIAGNOSIS — M48061 Spinal stenosis, lumbar region without neurogenic claudication: Secondary | ICD-10-CM | POA: Diagnosis not present

## 2020-09-13 DIAGNOSIS — M48062 Spinal stenosis, lumbar region with neurogenic claudication: Secondary | ICD-10-CM | POA: Diagnosis not present

## 2020-09-13 DIAGNOSIS — M5126 Other intervertebral disc displacement, lumbar region: Secondary | ICD-10-CM | POA: Diagnosis not present

## 2020-09-13 DIAGNOSIS — Z681 Body mass index (BMI) 19 or less, adult: Secondary | ICD-10-CM | POA: Diagnosis not present

## 2020-09-13 DIAGNOSIS — M5440 Lumbago with sciatica, unspecified side: Secondary | ICD-10-CM | POA: Diagnosis not present

## 2020-09-13 DIAGNOSIS — I10 Essential (primary) hypertension: Secondary | ICD-10-CM | POA: Diagnosis not present

## 2020-09-13 DIAGNOSIS — M5416 Radiculopathy, lumbar region: Secondary | ICD-10-CM | POA: Diagnosis not present

## 2020-09-13 DIAGNOSIS — M47816 Spondylosis without myelopathy or radiculopathy, lumbar region: Secondary | ICD-10-CM | POA: Diagnosis not present

## 2020-09-13 DIAGNOSIS — M5136 Other intervertebral disc degeneration, lumbar region: Secondary | ICD-10-CM | POA: Diagnosis not present

## 2020-09-25 ENCOUNTER — Other Ambulatory Visit: Payer: Self-pay

## 2020-09-25 ENCOUNTER — Telehealth: Payer: Self-pay | Admitting: Neurology

## 2020-09-25 MED ORDER — CARBIDOPA-LEVODOPA 25-100 MG PO TABS: 1.0000 | ORAL_TABLET | Freq: Three times a day (TID) | ORAL | 0 refills | Status: DC

## 2020-09-25 NOTE — Telephone Encounter (Signed)
Patient called and said she needs a new prescription sent in for for carbidopa levodopa 25-100 MG.  [facility]  Also, patient reports her Dr. Venetia Maxon referral visit found a ruptured disk, surgery is 10/05/20.

## 2020-09-26 ENCOUNTER — Encounter (INDEPENDENT_AMBULATORY_CARE_PROVIDER_SITE_OTHER): Payer: Self-pay | Admitting: *Deleted

## 2020-09-28 ENCOUNTER — Other Ambulatory Visit: Payer: Self-pay | Admitting: Neurology

## 2020-10-05 DIAGNOSIS — M4727 Other spondylosis with radiculopathy, lumbosacral region: Secondary | ICD-10-CM | POA: Diagnosis not present

## 2020-10-05 DIAGNOSIS — M5126 Other intervertebral disc displacement, lumbar region: Secondary | ICD-10-CM | POA: Diagnosis not present

## 2020-10-05 DIAGNOSIS — M5117 Intervertebral disc disorders with radiculopathy, lumbosacral region: Secondary | ICD-10-CM | POA: Diagnosis not present

## 2020-10-25 DIAGNOSIS — L570 Actinic keratosis: Secondary | ICD-10-CM | POA: Diagnosis not present

## 2020-10-25 DIAGNOSIS — Z1283 Encounter for screening for malignant neoplasm of skin: Secondary | ICD-10-CM | POA: Diagnosis not present

## 2020-10-25 DIAGNOSIS — L814 Other melanin hyperpigmentation: Secondary | ICD-10-CM | POA: Diagnosis not present

## 2020-10-25 DIAGNOSIS — I781 Nevus, non-neoplastic: Secondary | ICD-10-CM | POA: Diagnosis not present

## 2020-11-21 DIAGNOSIS — H40013 Open angle with borderline findings, low risk, bilateral: Secondary | ICD-10-CM | POA: Diagnosis not present

## 2020-12-12 ENCOUNTER — Telehealth: Payer: Self-pay | Admitting: Neurology

## 2020-12-12 NOTE — Telephone Encounter (Signed)
[facility] and left a message requesting notes to be sent to [place] from Dr. Venetia Maxon. Provided patients information and our contact information.

## 2020-12-12 NOTE — Telephone Encounter (Signed)
Please call Dr. Rush Farmer office.  We referred pt in Feb for lumbar radiculopathy.  Would like copy of notes.  Need those today as pt coming in later this week.  thanks

## 2020-12-12 NOTE — Progress Notes (Signed)
Assessment/Plan:   1.  Parkinsons Disease  -Continue carbidopa/levodopa 25/100, 1 tablet 3 times per day  2.  RBD/insomnia  -Have discussed clonazepam.  She does not wish to try for right now.  -On melatonin without success.  -Patient will continue trazodone, 50 mg - pt using rarely - prn.  3.  Fuchs dystrophy  -following with ophthalmology  4.  Lumbar radiculopathy  -MRI lumbar spine demonstrating L5-S1 radiculopathy, affecting the right S1 nerve root.  Patient subsequently had sx with Dr. Vertell Limber.  5.  Doing well.  F/u 6-8 months Subjective:   Hailey Kelly was seen today in follow up for Parkinsons disease.  My previous records were reviewed prior to todays visit as well as outside records available to me. Pt denies falls.  She is riding spin bike and doing tai chi for exercise.  She is walking as well for exercise. Pt denies lightheadedness, near syncope.  No hallucinations.  Mood has been good.  Started trazodone last visit for insomnia and she reports that she uses rarely as needed.  Patient was having back pain last visit.  MRI lumbar spine was completed.  There was a disc extrusion at L5-S1 affecting the right S1 nerve root.  She had previously reported the symptoms are on the left.  We had offered physical therapy because of that, but also offered a neurosurgical opinion.  She wanted that opinion as opposed to the physical therapy.  I called Dr. Donald Pore office yesterday for those records and reviewed them.  She did subsequently have a microdiscectomy.  She recovered well and feels well.  Current prescribed movement disorder medications: carbidopa/levodopa 25/100, 1 tablet 3 times per day Trazodone, 50 mg (started last visit)   ALLERGIES:  No Known Allergies  CURRENT MEDICATIONS:  Outpatient Encounter Medications as of 12/14/2020  Medication Sig   carbidopa-levodopa (SINEMET IR) 25-100 MG tablet Take 1 tablet by mouth 3 (three) times daily.   metoprolol succinate  (TOPROL-XL) 50 MG 24 hr tablet Take 50 mg by mouth daily. Take with or immediately following a meal.   naproxen sodium (ALEVE) 220 MG tablet Take 220 mg by mouth as needed.   tiZANidine (ZANAFLEX) 4 MG capsule Take 4 mg by mouth 3 (three) times daily as needed for muscle spasms.   traZODone (DESYREL) 50 MG tablet Take 1 tablet (50 mg total) by mouth at bedtime.   No facility-administered encounter medications on file as of 12/14/2020.    Objective:   PHYSICAL EXAMINATION:    VITALS:   Vitals:   12/14/20 1018  BP: 128/74  Pulse: 71  SpO2: 98%  Weight: 106 lb (48.1 kg)  Height: 5' 4"  (1.626 m)     GEN:  The patient appears stated age and is in NAD. HEENT:  Normocephalic, atraumatic.  The mucous membranes are moist. The superficial temporal arteries are without ropiness or tenderness. CV:  RRR Lungs:  CTAB Neck/HEME:  There are no carotid bruits bilaterally.  Neurological examination:  Orientation: The patient is alert and oriented x3. Cranial nerves: There is good facial symmetry without facial hypomimia. The speech is fluent and clear. Soft palate rises symmetrically and there is no tongue deviation. Hearing is intact to conversational tone. Sensation: Sensation is intact to light touch throughout Motor: Strength is at least antigravity x4.  Movement examination: Tone: There is nl tone in the ue/le Abnormal movements: none Coordination:  There is no decremation with RAM's, with any form of RAMS, including alternating supination and pronation  of the forearm, hand opening and closing, finger taps, heel taps and toe taps. Gait and Station: The patient has no difficulty arising out of a deep-seated chair without the use of the hands. The patient's stride length is normal.     Total time spent on today's visit was 20 minutes, including both face-to-face time and nonface-to-face time.  Time included that spent on review of records (prior notes available to me/labs/imaging if  pertinent), discussing treatment and goals, answering patient's questions and coordinating care.  Cc:  Monico Blitz, MD

## 2020-12-14 ENCOUNTER — Other Ambulatory Visit: Payer: Self-pay

## 2020-12-14 ENCOUNTER — Encounter: Payer: Self-pay | Admitting: Neurology

## 2020-12-14 ENCOUNTER — Ambulatory Visit (INDEPENDENT_AMBULATORY_CARE_PROVIDER_SITE_OTHER): Payer: Medicare PPO | Admitting: Neurology

## 2020-12-14 VITALS — BP 128/74 | HR 71 | Ht 64.0 in | Wt 106.0 lb

## 2020-12-14 DIAGNOSIS — G20 Parkinson's disease: Secondary | ICD-10-CM | POA: Diagnosis not present

## 2020-12-14 MED ORDER — CARBIDOPA-LEVODOPA 25-100 MG PO TABS: 1.0000 | ORAL_TABLET | Freq: Three times a day (TID) | ORAL | 2 refills | Status: DC

## 2020-12-14 NOTE — Patient Instructions (Signed)
Online Resources for Power over Parkinson's Group May 2022  Local [place] Online Groups  Power over Parkinson's Group :    Upcoming Power over Parkinson's Meetings:  2nd Wednesdays of the month at 2 pm:  June 8th, July 13th Contact Amy Marriott at [email or web address] if interested in participating in this group Parkinson's Care Partners Group:    3rd Mondays, Contact Misty Paladino Atypical Parkinsonian Patient Group:   4th Wednesdays, Contact Misty Paladino If you are interested in participating in these groups with Misty, please contact her directly for how to join those meetings.  Her contact information is [email or web address].   Parkinson Foundation:  www.parkinson.org PD Health at Home continues:  Mindfulness Mondays, Expert Briefing Tuesdays, Wellness Wednesdays, Take Time Thursdays, Fitness Fridays Register for expert briefings (webinars) at [email or web address]  Please check out their website to sign up for emails and see their full online offerings  [employer] Foundation:  www.[email or web address]  Check out additional information on their website to see their full online offerings  [employer] Phin[employer] Foundation:  www.davisphinneyfoundation.org Upcoming Webinar:  Stay tuned Care Partner Monthly Meetup.  With Connie Carpenter Phinney.  First Tuesday of each month, 2 pm Joy Breaks:  First Wednesday of each month, 2-3 pm. There will be art, doodling, making, crafting, listening, laughing, stories, and everything in between. No art experience necessary. No supplies required. Just show up for joy!  Register on their website. Check out additional information to Live Well Today on their website  Parkinson and Movement Disorders (PMD) Alliance:  www.pmdalliance.org [employer]Life Online:  Online Education Events Sign up for emails, which are sent weekly to give you updates on programming and online offerings     Parkinson's Association of [place]:   www.parkinsonassociation.org Information on online support groups, education events, and online exercises including Yoga, Parkinson's exercises and more-LOTS of information on links to PD resources and online events Virtual Support Group through Parkinson's Association of [place]; next one is scheduled for Wednesday, May 4th, 2022 at 2 pm. (These are typically scheduled for the 1st Wednesday of the month at 2 pm).  Visit website for details.  Additional links for movement activities: PWR! Moves Classes at [facility] Exercise [facility] HAVE RESUMED!  Wednesdays 10 and 11 am.  Contact Amy Marriott, PT [email or web address] or [phone] if interested Here is a link to the PWR!Moves classes on Zoom from [place] Parkinson's Foundation - Daily Mon-Sat at 10:00. Via Zoom, FREE and open to all.  There is also a link below via Facebook if you use that platform. https://www.[email or web address].org/mpf-programs/exercise-and-movement-activities https://www.facebook.com/ParkinsonsMI.org/posts/pwr-moves-exercise-class-parkinson-wellness-recovery-online-with-angee-ludwa-pt-/10156827878021813/ Parkinson's Wellness Recovery (PWR! Moves)  www.pwr4life.org Info on the PWR! Virtual Experience:  You will have access to our expertise through self-assessment, guided plans that start with the PD-specific fundamentals, educational content, tips, Q&A with an expert, and a growing library of PD-specific pre-recorded and live exercise classes of varying types and intensity - both physical and cognitive! If that is not enough, we offer 1:1 wellness consultations (in-person or virtual) to personalize your PWR! Virtual Experience.  Check out the PWR! Move of the month on the Parkinson Wellness Recovery website:  https://www.pwr4life.org/pwr-move-of-the-month-4/ Parkinson Foundation Fitness Fridays:  As part of the PD Health @ Home program, this free video series focuses each week on one aspect of fitness designed to support  people living with Parkinson's.  These weekly videos highlight the Parkinson Foundation recent fitness guidelines for people with Parkinson's disease.  www.parkinson.org/understanding-parkinsons/coronavirus/PD-health-at-home/Fitness-Fridays Dance for PD website is offering free, live-stream classes throughout the week, as well as links   to digital library of classes:  https://danceforparkinsons.org/ Dance for Parkinson's Class:  [employer] [place].  Free offering for people with Parkinson's and care partners; virtual class.  For more information, contact [phone] or email Magalli Morana at [email or web address] Virtual dance and Pilates for Parkinson's classes: Click on the Community Tab> Parkinson's Movement Initiative Tab.  To register for classes and for more information, visit www.americandancefestival.org and click the "community" tab.     [employer] Parkinson's Cycling Classes  [employer]: 1pm on Fridays-Live classes at [employer] (Contact Margaret Hazen at [email or web address] or [phone]) [employer]: Virtual Classes Mondays and Thursdays /Live classes Tuesday, Wednesday and Thursday (contact Marlee at [email or web address]  or [phone])  [place] Steady Boxing Three levels of classes are offered Tuesdays and Thursdays:  10:30 am,  12 noon & 1:45 pm at [employer] [facility].  Active Stretching with Maria, New Class starting in March, on Fridays To observe a class or for  more information, call [phone] or email [email or web address] [employer]: Online Caregiver Education Opportunities:  www.well-springsolutions.org/caregiver-education/caregiver-support-group.  You may also contact Jodi Kolada at [email or web address] or [phone].   [facility] Navigator:  Just1Navigator program, a free service to help individuals and families through the journey of determining care for older adults.  The "Navigator" is a  social worker, Nicole Reynolds, who will speak with a prospective client and/or loved ones to provide an assessment of the situation and a set of recommendations for a personalized care plan -- all free of charge, and whether [facility] [employer] offers the needed service or not. If the need is not a service we provide, we are well-connected with reputable programs in town that we can refer you to.  www.well-springsolutions.org or to speak with the Navigator, call [phone].    

## 2020-12-28 DIAGNOSIS — Z0000 Encounter for general adult medical examination without abnormal findings: Secondary | ICD-10-CM | POA: Diagnosis not present

## 2020-12-28 DIAGNOSIS — Z7189 Other specified counseling: Secondary | ICD-10-CM | POA: Diagnosis not present

## 2020-12-28 DIAGNOSIS — E7800 Pure hypercholesterolemia, unspecified: Secondary | ICD-10-CM | POA: Diagnosis not present

## 2020-12-28 DIAGNOSIS — E559 Vitamin D deficiency, unspecified: Secondary | ICD-10-CM | POA: Diagnosis not present

## 2020-12-28 DIAGNOSIS — I10 Essential (primary) hypertension: Secondary | ICD-10-CM | POA: Diagnosis not present

## 2020-12-28 DIAGNOSIS — R5383 Other fatigue: Secondary | ICD-10-CM | POA: Diagnosis not present

## 2020-12-28 DIAGNOSIS — Z1339 Encounter for screening examination for other mental health and behavioral disorders: Secondary | ICD-10-CM | POA: Diagnosis not present

## 2020-12-28 DIAGNOSIS — Z1331 Encounter for screening for depression: Secondary | ICD-10-CM | POA: Diagnosis not present

## 2020-12-28 DIAGNOSIS — Z79899 Other long term (current) drug therapy: Secondary | ICD-10-CM | POA: Diagnosis not present

## 2020-12-28 DIAGNOSIS — Z299 Encounter for prophylactic measures, unspecified: Secondary | ICD-10-CM | POA: Diagnosis not present

## 2021-04-16 DIAGNOSIS — Z299 Encounter for prophylactic measures, unspecified: Secondary | ICD-10-CM | POA: Diagnosis not present

## 2021-04-16 DIAGNOSIS — G20 Parkinson's disease: Secondary | ICD-10-CM | POA: Diagnosis not present

## 2021-04-16 DIAGNOSIS — N390 Urinary tract infection, site not specified: Secondary | ICD-10-CM | POA: Diagnosis not present

## 2021-04-16 DIAGNOSIS — I10 Essential (primary) hypertension: Secondary | ICD-10-CM | POA: Diagnosis not present

## 2021-04-16 DIAGNOSIS — Z2821 Immunization not carried out because of patient refusal: Secondary | ICD-10-CM | POA: Diagnosis not present

## 2021-04-16 DIAGNOSIS — R350 Frequency of micturition: Secondary | ICD-10-CM | POA: Diagnosis not present

## 2021-04-16 DIAGNOSIS — Z681 Body mass index (BMI) 19 or less, adult: Secondary | ICD-10-CM | POA: Diagnosis not present

## 2021-04-19 DIAGNOSIS — Z23 Encounter for immunization: Secondary | ICD-10-CM | POA: Diagnosis not present

## 2021-04-27 DIAGNOSIS — N390 Urinary tract infection, site not specified: Secondary | ICD-10-CM | POA: Diagnosis not present

## 2021-04-27 DIAGNOSIS — R251 Tremor, unspecified: Secondary | ICD-10-CM | POA: Diagnosis not present

## 2021-04-27 DIAGNOSIS — Z681 Body mass index (BMI) 19 or less, adult: Secondary | ICD-10-CM | POA: Diagnosis not present

## 2021-04-27 DIAGNOSIS — M62838 Other muscle spasm: Secondary | ICD-10-CM | POA: Diagnosis not present

## 2021-04-27 DIAGNOSIS — I10 Essential (primary) hypertension: Secondary | ICD-10-CM | POA: Diagnosis not present

## 2021-04-27 DIAGNOSIS — Z299 Encounter for prophylactic measures, unspecified: Secondary | ICD-10-CM | POA: Diagnosis not present

## 2021-05-07 DIAGNOSIS — Z681 Body mass index (BMI) 19 or less, adult: Secondary | ICD-10-CM | POA: Diagnosis not present

## 2021-05-07 DIAGNOSIS — I10 Essential (primary) hypertension: Secondary | ICD-10-CM | POA: Diagnosis not present

## 2021-05-07 DIAGNOSIS — R350 Frequency of micturition: Secondary | ICD-10-CM | POA: Diagnosis not present

## 2021-05-07 DIAGNOSIS — Z299 Encounter for prophylactic measures, unspecified: Secondary | ICD-10-CM | POA: Diagnosis not present

## 2021-05-07 DIAGNOSIS — G20 Parkinson's disease: Secondary | ICD-10-CM | POA: Diagnosis not present

## 2021-05-07 DIAGNOSIS — N390 Urinary tract infection, site not specified: Secondary | ICD-10-CM | POA: Diagnosis not present

## 2021-05-14 DIAGNOSIS — N3000 Acute cystitis without hematuria: Secondary | ICD-10-CM | POA: Diagnosis not present

## 2021-05-14 DIAGNOSIS — N952 Postmenopausal atrophic vaginitis: Secondary | ICD-10-CM | POA: Diagnosis not present

## 2021-05-15 DIAGNOSIS — N3000 Acute cystitis without hematuria: Secondary | ICD-10-CM | POA: Diagnosis not present

## 2021-06-14 DIAGNOSIS — Z299 Encounter for prophylactic measures, unspecified: Secondary | ICD-10-CM | POA: Diagnosis not present

## 2021-06-14 DIAGNOSIS — Z713 Dietary counseling and surveillance: Secondary | ICD-10-CM | POA: Diagnosis not present

## 2021-06-14 DIAGNOSIS — I10 Essential (primary) hypertension: Secondary | ICD-10-CM | POA: Diagnosis not present

## 2021-06-14 DIAGNOSIS — Z681 Body mass index (BMI) 19 or less, adult: Secondary | ICD-10-CM | POA: Diagnosis not present

## 2021-06-14 DIAGNOSIS — Z789 Other specified health status: Secondary | ICD-10-CM | POA: Diagnosis not present

## 2021-07-03 NOTE — Progress Notes (Signed)
° ° °  Assessment/Plan:   1.  Parkinsons Disease  -Continue carbidopa/levodopa 25/100, 1 tablet 3 times per day  2.  RBD/insomnia  -Have discussed clonazepam.  She does not wish to try for right now.  -On melatonin without success.  -she had trazodone but didn't want to take anything so decided not to take it.  3.  Fuchs dystrophy  -following with ophthalmology  4.  Lumbar radiculopathy  -MRI lumbar spine demonstrating L5-S1 radiculopathy, affecting the right S1 nerve root.  Patient subsequently had sx with Dr. Venetia Maxon in April, 2022.   Subjective:   Hailey Kelly was seen today in follow up for Parkinsons disease.  My previous records were reviewed prior to todays visit as well as outside records available to me. Pt denies falls.  She has had no lightheadedness or near syncope.  States that not using the trazodone.    Current prescribed movement disorder medications: carbidopa/levodopa 25/100, 1 tablet 3 times per day Trazodone, 50 mg   Previous meds:  trazodone given but didn't want to take so she d/c it ALLERGIES:  No Known Allergies  CURRENT MEDICATIONS:  Outpatient Encounter Medications as of 07/04/2021  Medication Sig   carbidopa-levodopa (SINEMET IR) 25-100 MG tablet Take 1 tablet by mouth 3 (three) times daily.   metoprolol succinate (TOPROL-XL) 50 MG 24 hr tablet Take 50 mg by mouth daily. Take with or immediately following a meal.   naproxen sodium (ALEVE) 220 MG tablet Take 220 mg by mouth as needed.   tiZANidine (ZANAFLEX) 4 MG capsule Take 4 mg by mouth 3 (three) times daily as needed for muscle spasms.   [DISCONTINUED] traZODone (DESYREL) 50 MG tablet Take 1 tablet (50 mg total) by mouth at bedtime. (Patient not taking: Reported on 07/04/2021)   No facility-administered encounter medications on file as of 07/04/2021.    Objective:   PHYSICAL EXAMINATION:    VITALS:   Vitals:   07/04/21 0915  BP: 128/73  Pulse: 84  SpO2: 97%  Weight: 108 lb 9.6 oz (49.3 kg)   Height: 5\' 4"  (1.626 m)      GEN:  The patient appears stated age and is in NAD. HEENT:  Normocephalic, atraumatic.  The mucous membranes are moist. The superficial temporal arteries are without ropiness or tenderness. CV:  RRR Lungs:  CTAB Neck/HEME:  There are no carotid bruits bilaterally.  Neurological examination:  Orientation: The patient is alert and oriented x3. Cranial nerves: There is good facial symmetry without facial hypomimia. The speech is fluent and clear. Soft palate rises symmetrically and there is no tongue deviation. Hearing is intact to conversational tone. Sensation: Sensation is intact to light touch throughout Motor: Strength is at least antigravity x4.  Movement examination: Tone: There is nl tone in the ue/le Abnormal movements: rare L thumb tremor Coordination:  There is no decremation with RAM's, with any form of RAMS, including alternating supination and pronation of the forearm, hand opening and closing, finger taps, heel taps and toe taps. Gait and Station: The patient has no difficulty arising out of a deep-seated chair without the use of the hands. The patient's stride length is normal.      Cc:  , MD

## 2021-07-04 ENCOUNTER — Encounter: Payer: Self-pay | Admitting: Neurology

## 2021-07-04 ENCOUNTER — Other Ambulatory Visit: Payer: Self-pay

## 2021-07-04 ENCOUNTER — Ambulatory Visit: Payer: Medicare PPO | Admitting: Neurology

## 2021-07-04 VITALS — BP 128/73 | HR 84 | Ht 64.0 in | Wt 108.6 lb

## 2021-07-04 DIAGNOSIS — G20 Parkinson's disease: Secondary | ICD-10-CM | POA: Diagnosis not present

## 2021-07-04 MED ORDER — CARBIDOPA-LEVODOPA 25-100 MG PO TABS: 1.0000 | ORAL_TABLET | Freq: Three times a day (TID) | ORAL | 1 refills | Status: DC

## 2021-07-04 NOTE — Patient Instructions (Signed)
Online Resources for Power over Parkinson's Group °December 2022 ° °Local [place] Online Groups  °Power over Parkinson's Group :   °Power Over Parkinson's Patient Education Group will be Wednesday, December 14th-*Hybrid meting*- in person at [place] [facility] location and via WEBEX at 2:00 pm.   °Upcoming Power over Parkinson's Meetings:  2nd Wednesdays of the month at 2 pm:  December 14th, January 11th °Contact Hailey Kelly at [email or web address] if interested in participating in this group °Parkinson's Care Partners Group:    3rd Mondays, Contact Hailey Kelly °Atypical Parkinsonian Patient Group:   4th Wednesdays, Contact Hailey Kelly °If you are interested in participating in these online groups with Hailey, please contact her directly for how to join those meetings.  Her contact information is [email or web address].   °Parkinson Foundation:  www.parkinson.org °PD Health at Home continues:  Mindfulness Mondays, Expert Briefing Tuesdays, Wellness Wednesdays, Take Time Thursdays, Fitness Fridays -Listings for June 2022 are on the website °Upcoming Webinar/Education:  The Importance of Addressing On-Off times in Parkinson's.  Wednesday, November 30th at 1:00 pm. °Register for expert briefings (webinars) at https://www.parkinson.org/resources-support/online-education/expert-briefings-webinars ° Please check out their website to sign up for emails and see their full online offerings ° °Michael J Fox Foundation:  www.michaeljfox.org  °Upcoming Webinar:   What Comes Next:  Living with Parkinson's Years after Diagnosis.  (a replay of a popular webinar), Thursday, December 15th at 12 noon °Check out additional information on their website to see their full online offerings ° °Davis Kelly Foundation:  www.davisphinneyfoundation.org °Upcoming Webinar:   Stay tuned. °Webinar Series:  Living with Parkinson's Meetup.   Third Thursdays of each month at 3 pm °Care Partner Monthly Meetup.  With  Hailey Kelly.  First Tuesday of each month, 2 pm °Joy Breaks:  First Wednesday of each month, 2-3 pm. There will be art, doodling, making, crafting, listening, laughing, stories, and everything in between. No art experience necessary. No supplies required. Just show up for joy!  Register on their website. °Check out additional information to Live Well Today on their website ° °Parkinson and Movement Disorders (PMD) Alliance:  www.pmdalliance.org °NeuroLife Online:  Online Education Events °Sign up for emails, which are sent weekly to give you updates on programming and online offerings ° °Parkinson's Association of [place]:  www.parkinsonassociation.org °Information on online support groups, education events, and online exercises including Yoga, Parkinson's exercises and more-LOTS of information on links to PD resources and online events °Virtual Support Group through Parkinson's Association of the [place]; next one is scheduled for Wednesday, December 7th at 2 pm.  (These are typically scheduled for the 1st Wednesday of the month at 2 pm).  Visit website for details. ° °Additional links for movement activities: °Parkinson's DRUMMING Classes/Music Therapy with Hailey Kelly:  This is a returning class and it's FREE!  2nd Mondays, continuing December 12th, 11:00 at the [employer] [facility].  Contact *Hailey Kelly at [email or web address] or Hailey Kelly at [phone] or [email or web address]  °PWR! Moves Classes at [facility].  Wednesdays 10 and 11 am.  Contact Hailey Kelly, PT [email or web address] if interested. °Here is a link to the PWR!Moves classes on Zoom from [place] Parkinson's Foundation - Daily Mon-Sat at 10:00. Via Zoom, FREE and open to all.  There is also a link below via Facebook if you use that  platform. °https://www.parkinsonsmi.org/mpf-programs/exercise-and-movement-activities °https://www.facebook.com/ParkinsonsMI.org/posts/pwr-moves-exercise-class-parkinson-wellness-recovery-online-with-angee-ludwa-pt-/10156827878021813/ °Parkinson's Wellness Recovery (PWR! Moves)  www.pwr4life.org °Info on the PWR! Virtual Experience:  You will have access to our expertise through self-assessment, guided plans   that start with the PD-specific fundamentals, educational content, tips, Q&A with an expert, and a growing library of PD-specific pre-recorded and live exercise classes of varying types and intensity - both physical and cognitive! If that is not enough, we offer 1:1 wellness consultations (in-person or virtual) to personalize your PWR! Virtual Experience.  °Parkinson Foundation Fitness Fridays:  °As part of the PD Health @ Home program, this free video series focuses each week on one aspect of fitness designed to support people living with Parkinson's.  These weekly videos highlight the Parkinson Foundation recent fitness guidelines for people with Parkinson's disease. ° www.parkinson.org/understanding-parkinsons/coronavirus/PD-health-at-home/Fitness-Fridays °Dance for PD website is offering free, live-stream classes throughout the week, as well as links to digital library of classes:  https://danceforparkinsons.org/ °Dance for Parkinson's Class:  [facility]ance [employer] [place].  Free offering for people with Parkinson's and care partners; virtual class.  °For more information, contact [phone] or email Hailey Kelly at [email or web address] °Dance for Parkinson's in-person class.  February 1-April 26, Wednesdays 4-5 pm.  Free class for people with Parkinson's disease, at [address], [place].  Contact [phone] or [email or web address] to register °Virtual dance and Pilates for Parkinson's classes: Click on the Community Tab> Parkinson's Movement Initiative Tab.  To register for  classes and for more information, visit www.americandancefestival.org and click the “community” tab.  °[employer] Parkinson's Cycling Classes  °[facility]: 1pm on Fridays-Live classes at [facility] (Contact Hailey Kelly at [email or web address] or [phone]) °[facility]: Virtual Classes Mondays and Thursdays /Live classes Tuesday, Wednesday and Thursday (contact Hailey Kelly at [email or web address]  or [phone]) °[place] [facility] °Varied levels of classes are offered Mondays, Tuesdays and Thursdays at [facility].  °Stretching with Hailey Kelly weekly class is also offered for people with Parkinson's °To observe a class or for more information, call [phone] or email Hailey Kelly at [email or web address] ° °Well[employer]Spring [employer]: °Online Caregiver Education Opportunities:  www.well-springsolutions.org/caregiver-education/caregiver-support-group.  You may also contact Hailey Kelly at [email or web address] or [phone].    °Holiday Tea for Family Caregivers, Tuesday December 13th, 4-6 pm, [facility], [address], [place] °Tackling Caregiver Stress, Wednesday December 14th, 11:30-1:00, [facility] [place], [address] °Well-Spring Navigator:  Just1Navigator program, a free service to help individuals and families through the journey of determining care for older adults.  The “Navigator” is a social worker, Hailey Kelly, who will speak with a prospective client and/or loved ones to provide an assessment of the situation and a set of recommendations for a personalized care plan -- all free of charge, and whether [facility] offers the needed service or not. If the need is not a service we provide, we are well-connected with reputable programs in town that we can refer you to.  www.well-springsolutions.org or to speak with the Navigator, call [phone]. ° ° ° °

## 2021-07-06 ENCOUNTER — Other Ambulatory Visit: Payer: Self-pay | Admitting: Neurology

## 2021-07-06 DIAGNOSIS — G20 Parkinson's disease: Secondary | ICD-10-CM

## 2021-08-13 DIAGNOSIS — N952 Postmenopausal atrophic vaginitis: Secondary | ICD-10-CM | POA: Diagnosis not present

## 2021-08-13 DIAGNOSIS — R300 Dysuria: Secondary | ICD-10-CM | POA: Diagnosis not present

## 2021-08-13 DIAGNOSIS — N3000 Acute cystitis without hematuria: Secondary | ICD-10-CM | POA: Diagnosis not present

## 2021-10-29 DIAGNOSIS — Z1283 Encounter for screening for malignant neoplasm of skin: Secondary | ICD-10-CM | POA: Diagnosis not present

## 2021-10-29 DIAGNOSIS — D239 Other benign neoplasm of skin, unspecified: Secondary | ICD-10-CM | POA: Diagnosis not present

## 2021-10-29 DIAGNOSIS — L570 Actinic keratosis: Secondary | ICD-10-CM | POA: Diagnosis not present

## 2021-11-21 DIAGNOSIS — H40013 Open angle with borderline findings, low risk, bilateral: Secondary | ICD-10-CM | POA: Diagnosis not present

## 2022-01-02 DIAGNOSIS — Z299 Encounter for prophylactic measures, unspecified: Secondary | ICD-10-CM | POA: Diagnosis not present

## 2022-01-02 DIAGNOSIS — Z1331 Encounter for screening for depression: Secondary | ICD-10-CM | POA: Diagnosis not present

## 2022-01-02 DIAGNOSIS — Z1339 Encounter for screening examination for other mental health and behavioral disorders: Secondary | ICD-10-CM | POA: Diagnosis not present

## 2022-01-02 DIAGNOSIS — I10 Essential (primary) hypertension: Secondary | ICD-10-CM | POA: Diagnosis not present

## 2022-01-02 DIAGNOSIS — E7800 Pure hypercholesterolemia, unspecified: Secondary | ICD-10-CM | POA: Diagnosis not present

## 2022-01-02 DIAGNOSIS — R5383 Other fatigue: Secondary | ICD-10-CM | POA: Diagnosis not present

## 2022-01-02 DIAGNOSIS — Z7189 Other specified counseling: Secondary | ICD-10-CM | POA: Diagnosis not present

## 2022-01-02 DIAGNOSIS — E559 Vitamin D deficiency, unspecified: Secondary | ICD-10-CM | POA: Diagnosis not present

## 2022-01-02 DIAGNOSIS — Z681 Body mass index (BMI) 19 or less, adult: Secondary | ICD-10-CM | POA: Diagnosis not present

## 2022-01-02 DIAGNOSIS — Z1211 Encounter for screening for malignant neoplasm of colon: Secondary | ICD-10-CM | POA: Diagnosis not present

## 2022-01-02 DIAGNOSIS — Z0000 Encounter for general adult medical examination without abnormal findings: Secondary | ICD-10-CM | POA: Diagnosis not present

## 2022-01-02 DIAGNOSIS — Z79899 Other long term (current) drug therapy: Secondary | ICD-10-CM | POA: Diagnosis not present

## 2022-01-15 NOTE — Progress Notes (Unsigned)
    Assessment/Plan:   1.  Parkinsons Disease  -Continue carbidopa/levodopa 25/100, 1 tablet 3 times per day  2.  Fuchs dystrophy  -following with ophthalmology  3.  Lumbar radiculopathy  -MRI lumbar spine demonstrating L5-S1 radiculopathy, affecting the right S1 nerve root.    -Has followed up with Dr. Venetia Maxon in the past.  Has not seen anyone recently.   Subjective:   ELYSSE Kelly was seen today in follow up for Parkinsons disease.  My previous records were reviewed prior to todays visit as well as outside records available to me. Pt denies falls.  Pt denies lightheadedness, near syncope.  No hallucinations.  Mood has been good.  Current prescribed movement disorder medications: carbidopa/levodopa 25/100, 1 tablet 3 times per day   Previous meds:  trazodone given but didn't want to take so she d/c it  ALLERGIES:  No Known Allergies  CURRENT MEDICATIONS:  Outpatient Encounter Medications as of 01/16/2022  Medication Sig   carbidopa-levodopa (SINEMET IR) 25-100 MG tablet TAKE 1 TABLET THREE TIMES DAILY   metoprolol succinate (TOPROL-XL) 50 MG 24 hr tablet Take 50 mg by mouth daily. Take with or immediately following a meal.   naproxen sodium (ALEVE) 220 MG tablet Take 220 mg by mouth as needed.   tiZANidine (ZANAFLEX) 4 MG capsule Take 4 mg by mouth 3 (three) times daily as needed for muscle spasms.   No facility-administered encounter medications on file as of 01/16/2022.    Objective:   PHYSICAL EXAMINATION:    VITALS:   There were no vitals filed for this visit.     GEN:  The patient appears stated age and is in NAD. HEENT:  Normocephalic, atraumatic.  The mucous membranes are moist. The superficial temporal arteries are without ropiness or tenderness. CV:  RRR Lungs:  CTAB Neck/HEME:  There are no carotid bruits bilaterally.  Neurological examination:  Orientation: The patient is alert and oriented x3. Cranial nerves: There is good facial symmetry without  facial hypomimia. The speech is fluent and clear. Soft palate rises symmetrically and there is no tongue deviation. Hearing is intact to conversational tone. Sensation: Sensation is intact to light touch throughout Motor: Strength is at least antigravity x4.  Movement examination: Tone: There is nl tone in the ue/le Abnormal movements: rare L thumb tremor Coordination:  There is no decremation with RAM's, with any form of RAMS, including alternating supination and pronation of the forearm, hand opening and closing, finger taps, heel taps and toe taps. Gait and Station: The patient has no difficulty arising out of a deep-seated chair without the use of the hands. The patient's stride length is normal.    Total time spent on today's visit was *** minutes, including both face-to-face time and nonface-to-face time.  Time included that spent on review of records (prior notes available to me/labs/imaging if pertinent), discussing treatment and goals, answering patient's questions and coordinating care.   Cc:  Kirstie Peri, MD

## 2022-01-16 ENCOUNTER — Ambulatory Visit: Payer: Medicare PPO | Admitting: Neurology

## 2022-01-16 ENCOUNTER — Encounter: Payer: Self-pay | Admitting: Neurology

## 2022-01-16 VITALS — BP 134/80 | HR 58 | Ht 64.0 in | Wt 108.0 lb

## 2022-01-16 DIAGNOSIS — G20 Parkinson's disease: Secondary | ICD-10-CM | POA: Diagnosis not present

## 2022-01-16 MED ORDER — MIRTAZAPINE 15 MG PO TABS: 15.0000 mg | ORAL_TABLET | Freq: Every day | ORAL | 1 refills | Status: DC

## 2022-01-16 NOTE — Patient Instructions (Signed)
Start mirtazapine, 15 mg at bedtime for sleep.    The physicians and staff at [facility] are committed to providing excellent care. You may receive a survey requesting feedback about your experience at our office. We strive to receive "very good" responses to the survey questions. If you feel that your experience would prevent you from giving the office a "very good " response, please contact our office to try to remedy the situation. We may be reached at [phone]. Thank you for taking the time out of your busy day to complete the survey.   Local and Online Resources for Power over Parkinson's Group June 2023  LOCAL [place] PARKINSON'S GROUPS  Power over Parkinson's Group:   Power Over Parkinson's Patient Education Group will be Wednesday, June 14th-*Hybrid meting*- in person at [facility] location and via [facility] at 2:00 pm.   Upcoming Power over [employer] Meetings:  2nd Wednesdays of the month at 2 pm:   June 14th, July 12th Contact Amy Marriott at [email or web address] if interested in participating in this group Parkinson's Care Partners Group:    3rd Mondays, Contact Misty Paladino Atypical Parkinsonian Patient Group:   4th Wednesdays, Contact Misty Paladino If you are interested in participating in these groups with Misty, please contact her directly for how to join those meetings.  Her contact information is [email or web address].    LOCAL EVENTS AND NEW OFFERINGS Dance Class for People with Parkinson's at [facility].  Friday, June 9th at 2 pm.  Nolen Mu by Sherrie Sport DPT students.  Contact [email or web address] to register or with questions. Ice Cream Social at [place]!  Thursday, June 15th, 5:30-7:00 pm.  RSVP to [email or web address] for attendance and free ice cream. Parkinson's T-shirts for sale!  Designed by a local group member, with funds going to Movement Disorders Fund.  $25.00  Technical sales engineer to purchase  [employer]! Moves [employer]  Instructor-Led Class offering at [employer]!  Wednesdays 1-2 pm, starting April 12th.   Contact Irma Newness, Visual merchandiser at [employer].  [email or web address]  ONLINE EDUCATION AND SUPPORT Parkinson Foundation:  www.parkinson.org PD Health at Home continues:  Mindfulness Mondays, Wellness Wednesdays, Fitness Fridays  Upcoming Education: Parkinson's 101:  What You and Your Family Should Know.  Wednesday, June 7th at 1:00 pm Register for expert briefings (webinars) at [email or web address] Please check out their website to sign up for emails and see their full online offerings   [employer] Foundation:  www.michaeljfox.org  Third Thursday Webinars:  On the third Thursday of every month at 12 p.m. ET, join our free live webinars to learn about various aspects of living with Parkinson's disease and our work to speed medical breakthroughs. Upcoming Webinar: REPLAY:  From Low Blood Pressure to Bladder Problems:  A Look at Lesser Known Parkinson's Symptoms.  Thursday, June 15th at 12 noon. Check out additional information on their website to see their full online offerings  [facility]:  www.davisphinneyfoundation.org Upcoming Webinar:   Stay tuned Webinar Series:  Living with Parkinson's Meetup.   Third Thursdays each month, 3 pm Care Partner Monthly Meetup.  With Jillene Bucks [place].  First Tuesday of each month, 2 pm Check out additional information to Live Well Today on their website  Parkinson and Movement Disorders (PMD) Alliance:  www.pmdalliance.org NeuroLife Online:  Online Education Events Sign up for emails, which are sent weekly to give you updates on programming and online offerings  Parkinson's Association of [place]:  www.parkinsonassociation.org Information on online support groups, education events, and online exercises including  Yoga, Parkinson's exercises and more-LOTS of  information on links to PD resources and online events Virtual Support Group through Parkinson's Association of the [place]; next one is scheduled for Wednesday, June 7th at 2 pm. (These are typically scheduled for the 1st Wednesday of the month at 2 pm).  Visit website for details. Save the date for "Caring for Parkinson's-Caring for You", 9th Annual Symposium.  In-person event in [place].  September 9th.  More info on registration to come. MOVEMENT AND EXERCISE OPPORTUNITIES PWR! Moves Classes at [facility].  Wednesdays 10 and 11 am.   Contact Amy Marriott, PT [email or web address] if interested. NEW PWR! Moves Class offering at [employer].  Wednesdays 1-2 pm, starting April 12th.  Contact Irma Newness, Visual merchandiser at [employer].  [email or web address] Here is a link to the PWR!Moves classes on Zoom from [place] - Daily Mon-Sat at 10:00. Via Zoom, FREE and open to all.  There is also a link below via Facebook if you use that platform.  CopCameras.is [email or web address]  Parkinson's Wellness Recovery (PWR! Moves)  www.pwr4life.org Info on the PWR! Virtual Experience:  You will have access to our expertise through self-assessment, guided plans that start with the PD-specific fundamentals, educational content, tips, Q&A with an expert, and a growing Engineering geologist of PD-specific pre-recorded and live exercise classes of varying types and intensity - both physical and cognitive! If that is not enough, we offer 1:1 wellness consultations (in-person or virtual) to personalize your PWR! Dance movement psychotherapist.  Parkinson [employer] Fridays:  As part of the PD Health @ Home program, this free video series focuses each week on one aspect of fitness designed to  support people living with Parkinson's.  These weekly videos highlight the Parkinson Foundation recent fitness guidelines for people with Parkinson's disease. MenusLocal.com.br Dance for PD website is offering free, live-stream classes throughout the week, as well as links to [employer] of classes:  https://danceforparkinsons.org/ Virtual dance and Pilates for Parkinson's classes: Click on the Community Tab> Parkinson's Movement Initiative Tab.  To register for classes and for more information, visit www.[email or web address].co.za and click the "community" tab.  [employer] Parkinson's Cycling Classes  [facility]:  Thursdays @ Noon-Live classes at [employer] ([employer] at [email or web address] or [phone]) [facility]: Virtual Classes Mondays and Thursdays Fawn Kirk classes Tuesday, Wednesday and Thursday (contact [place] at [email or web address]  or [phone]) [facility] Varied levels of classes are offered Tuesdays and Thursdays at [facility].  Stretching with Byrd Hesselbach weekly class is also offered for people with Parkinson's To observe a class or for more information, call [phone] or email Patricia Nettle at [email or web address] ADDITIONAL SUPPORT AND RESOURCES Well-Spring Solutions:Online Caregiver Education Opportunities:  www.well-springsolutions.org/caregiver-education/caregiver-support-group.  You may also contact Loleta Chance at [email or web address] or [phone].    Well-Spring Navigator:  [phone] program, a free service to help individuals and families through the journey of determining care for older adults.  The "Navigator" is a [employer], Child psychotherapist, who will speak with a prospective client and/or loved ones to provide an assessment of the situation and a set of recommendations for a personalized care plan -- all free of charge, and whether  Well[employer]Spring [employer] offers the needed service or not. If the need is not a service we provide, we are well-connected with reputable programs in town that we can refer you to.  www.well-springsolutions.org or to speak with the Navigator, call [phone]. Family Caregiver Programming in June:  [employer], Thursday, June 15th 11-12:30 at June 17. [place]  [address] [place].  Call [phone] to register

## 2022-03-27 ENCOUNTER — Other Ambulatory Visit: Payer: Self-pay | Admitting: Neurology

## 2022-03-27 DIAGNOSIS — G20 Parkinson's disease: Secondary | ICD-10-CM

## 2022-04-07 DIAGNOSIS — R9431 Abnormal electrocardiogram [ECG] [EKG]: Secondary | ICD-10-CM | POA: Diagnosis not present

## 2022-04-07 DIAGNOSIS — I10 Essential (primary) hypertension: Secondary | ICD-10-CM | POA: Diagnosis not present

## 2022-04-07 DIAGNOSIS — R001 Bradycardia, unspecified: Secondary | ICD-10-CM | POA: Diagnosis not present

## 2022-04-07 DIAGNOSIS — R079 Chest pain, unspecified: Secondary | ICD-10-CM | POA: Diagnosis not present

## 2022-04-07 DIAGNOSIS — I351 Nonrheumatic aortic (valve) insufficiency: Secondary | ICD-10-CM | POA: Diagnosis not present

## 2022-04-07 DIAGNOSIS — I214 Non-ST elevation (NSTEMI) myocardial infarction: Secondary | ICD-10-CM | POA: Diagnosis not present

## 2022-04-08 ENCOUNTER — Inpatient Hospital Stay: Admission: AD | Admit: 2022-04-08 | Payer: Medicare PPO | Source: Other Acute Inpatient Hospital | Admitting: Cardiology

## 2022-04-09 DIAGNOSIS — I10 Essential (primary) hypertension: Secondary | ICD-10-CM | POA: Diagnosis not present

## 2022-04-09 DIAGNOSIS — R079 Chest pain, unspecified: Secondary | ICD-10-CM | POA: Diagnosis not present

## 2022-04-09 DIAGNOSIS — I214 Non-ST elevation (NSTEMI) myocardial infarction: Secondary | ICD-10-CM | POA: Diagnosis not present

## 2022-04-10 DIAGNOSIS — I214 Non-ST elevation (NSTEMI) myocardial infarction: Secondary | ICD-10-CM | POA: Diagnosis not present

## 2022-04-10 DIAGNOSIS — I10 Essential (primary) hypertension: Secondary | ICD-10-CM | POA: Diagnosis not present

## 2022-04-11 DIAGNOSIS — R9431 Abnormal electrocardiogram [ECG] [EKG]: Secondary | ICD-10-CM | POA: Diagnosis not present

## 2022-04-11 DIAGNOSIS — I21B Myocardial infarction with coronary microvascular dysfunction: Secondary | ICD-10-CM | POA: Diagnosis not present

## 2022-04-11 DIAGNOSIS — I10 Essential (primary) hypertension: Secondary | ICD-10-CM | POA: Diagnosis not present

## 2022-04-11 DIAGNOSIS — R079 Chest pain, unspecified: Secondary | ICD-10-CM | POA: Diagnosis not present

## 2022-04-11 DIAGNOSIS — I214 Non-ST elevation (NSTEMI) myocardial infarction: Secondary | ICD-10-CM | POA: Diagnosis not present

## 2022-04-11 DIAGNOSIS — F0282 Dementia in other diseases classified elsewhere, unspecified severity, with psychotic disturbance: Secondary | ICD-10-CM | POA: Diagnosis not present

## 2022-04-11 DIAGNOSIS — R001 Bradycardia, unspecified: Secondary | ICD-10-CM | POA: Diagnosis not present

## 2022-04-11 DIAGNOSIS — R54 Age-related physical debility: Secondary | ICD-10-CM | POA: Diagnosis not present

## 2022-04-11 DIAGNOSIS — I351 Nonrheumatic aortic (valve) insufficiency: Secondary | ICD-10-CM | POA: Diagnosis not present

## 2022-04-11 DIAGNOSIS — G20A1 Parkinson's disease without dyskinesia, without mention of fluctuations: Secondary | ICD-10-CM | POA: Diagnosis not present

## 2022-04-12 ENCOUNTER — Telehealth: Payer: Self-pay | Admitting: Physician Assistant

## 2022-04-12 NOTE — Telephone Encounter (Signed)
Received sign out that this patient was still pending transfer to our facility. I called [facility] who confirmed she has since been transferred to [facility] instead and is no longer coming to [facility].

## 2022-04-14 DIAGNOSIS — R079 Chest pain, unspecified: Secondary | ICD-10-CM | POA: Diagnosis not present

## 2022-04-16 ENCOUNTER — Telehealth: Payer: Self-pay | Admitting: Neurology

## 2022-04-16 NOTE — Telephone Encounter (Signed)
Mild heart attack on 04/07/22, and they did a heart catheterization  on the 13th. They put her on two medicine Losartan 12.5mg  Atorvastatin 80mg   Pt wants to know if its okay to take these two medication with her parkinson medicine.

## 2022-04-16 NOTE — Telephone Encounter (Signed)
Pt called informed that Dr Tat stated Those medications  are okay for her to take.  I'm sorry that happened to her

## 2022-04-17 DIAGNOSIS — Z681 Body mass index (BMI) 19 or less, adult: Secondary | ICD-10-CM | POA: Diagnosis not present

## 2022-04-17 DIAGNOSIS — I252 Old myocardial infarction: Secondary | ICD-10-CM | POA: Diagnosis not present

## 2022-04-17 DIAGNOSIS — Z299 Encounter for prophylactic measures, unspecified: Secondary | ICD-10-CM | POA: Diagnosis not present

## 2022-04-17 DIAGNOSIS — I214 Non-ST elevation (NSTEMI) myocardial infarction: Secondary | ICD-10-CM | POA: Diagnosis not present

## 2022-04-17 DIAGNOSIS — I10 Essential (primary) hypertension: Secondary | ICD-10-CM | POA: Diagnosis not present

## 2022-04-17 DIAGNOSIS — Z2821 Immunization not carried out because of patient refusal: Secondary | ICD-10-CM | POA: Diagnosis not present

## 2022-04-17 DIAGNOSIS — E7800 Pure hypercholesterolemia, unspecified: Secondary | ICD-10-CM | POA: Diagnosis not present

## 2022-04-23 DIAGNOSIS — I2510 Atherosclerotic heart disease of native coronary artery without angina pectoris: Secondary | ICD-10-CM | POA: Diagnosis not present

## 2022-04-23 DIAGNOSIS — E785 Hyperlipidemia, unspecified: Secondary | ICD-10-CM | POA: Diagnosis not present

## 2022-04-23 DIAGNOSIS — I252 Old myocardial infarction: Secondary | ICD-10-CM | POA: Diagnosis not present

## 2022-04-23 DIAGNOSIS — I255 Ischemic cardiomyopathy: Secondary | ICD-10-CM | POA: Diagnosis not present

## 2022-05-04 IMAGING — MR MR LUMBAR SPINE W/O CM
5 series · 31 of 48 positions shown · non-contrast
Comparison: None.

CLINICAL DATA: Low back pain radiating down both legs

EXAM:
MRI LUMBAR SPINE WITHOUT CONTRAST
TECHNIQUE: Multiplanar, multisequence MR imaging of the lumbar spine was
performed. No intravenous contrast was administered.

[Series 5: T2 · sagittal · 4.0mm · 0.68mm/px · 6 of 16 slices shown (1 of 2)]
[im 1/16]
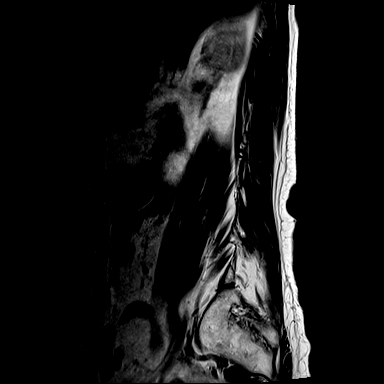
[im 4/16]
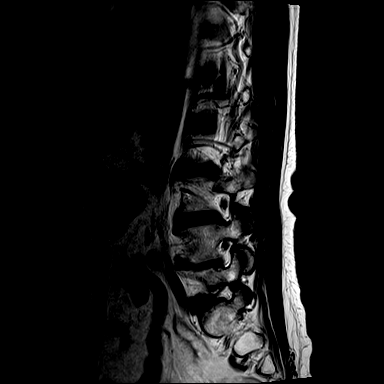
[im 7/16]
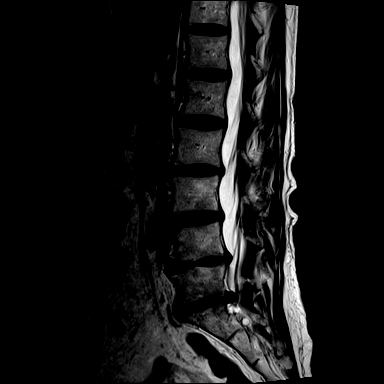
[im 10/16]
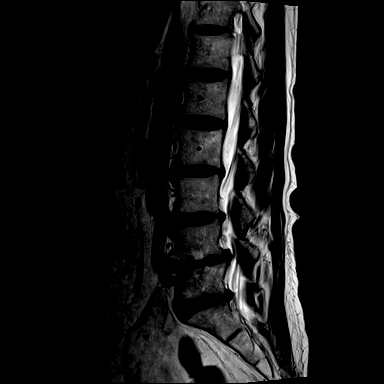
[im 13/16]
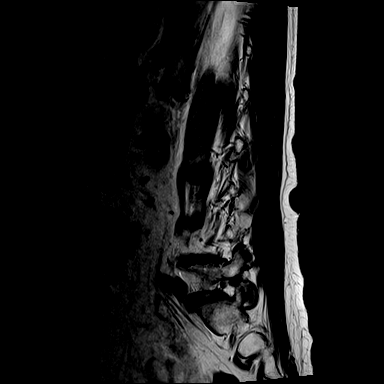
[im 16/16]
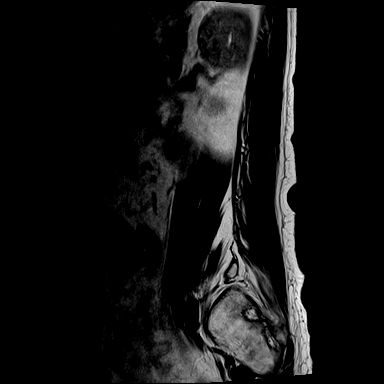

[Series 6: T1 · sagittal · 4.0mm · 0.81mm/px · 7 of 15 slices shown (1 of 2)]
[im 1/15]
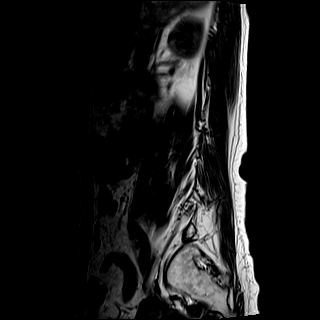
[im 3/15]
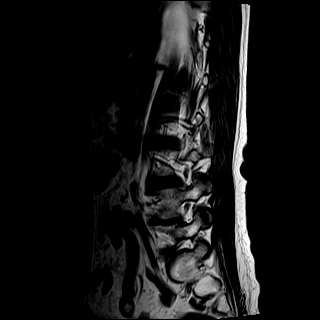
[im 5/15]
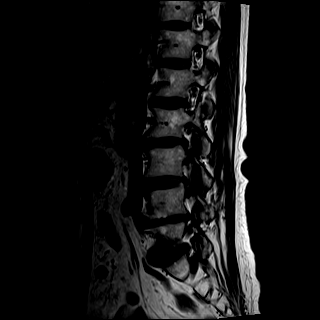
[im 8/15]
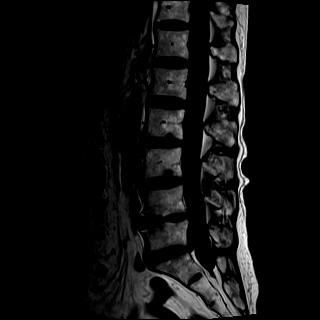
[im 10/15]
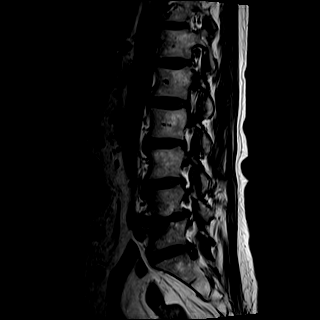
[im 12/15]
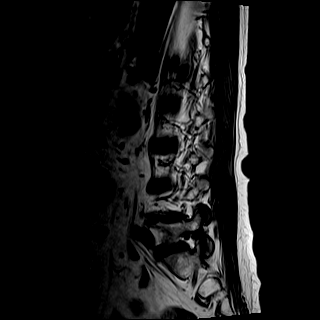
[im 15/15]
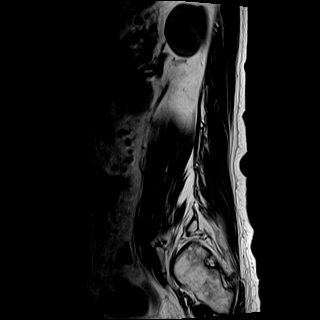

[Series 7: STIR · sagittal · 4.0mm · 0.51mm/px · 2 of 15 slices shown]
[im 1/15]
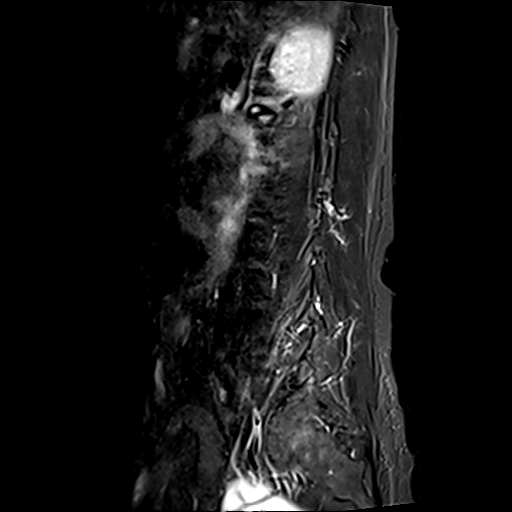
[im 3/15]
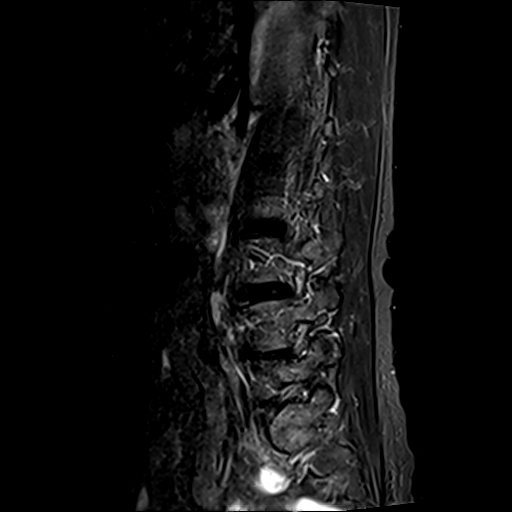

[Series 8: T2 · axial · 4.0mm · 0.70mm/px · z∈[-123,+61]mm · 8 of 32 slices shown (2 of 2)]
[im 1/32]
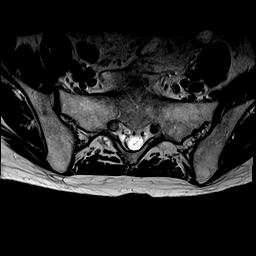
[im 5/32]
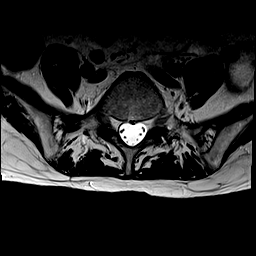
[im 10/32]
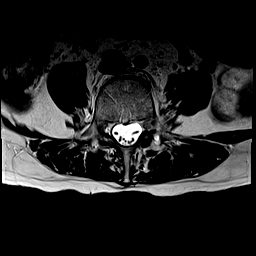
[im 15/32]
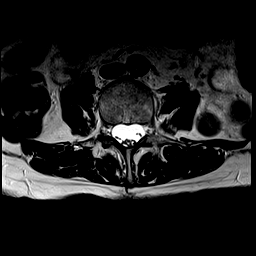
[im 17/32]
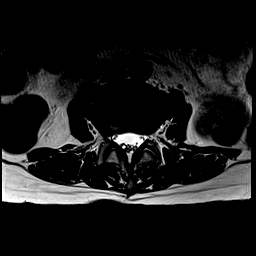
[im 22/32]
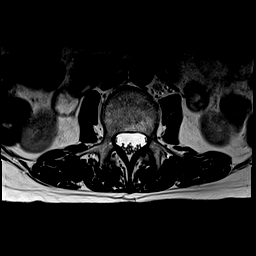
[im 27/32]
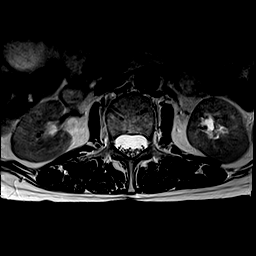
[im 32/32]
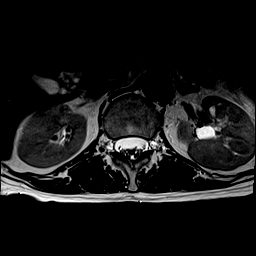

[Series 9: T1 · axial · 4.0mm · 0.35mm/px · z∈[-123,+61]mm · 8 of 32 slices shown (2 of 2)]
[im 1/32]
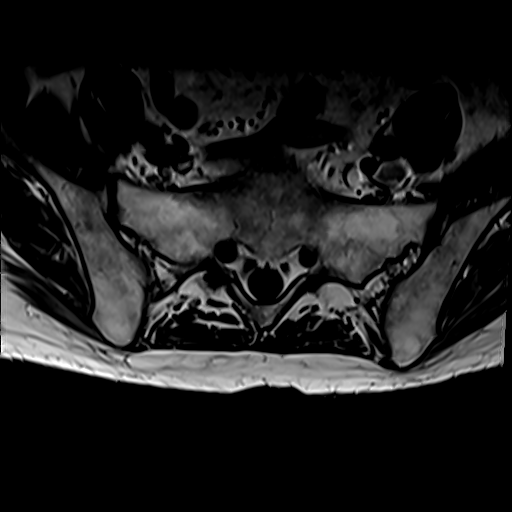
[im 5/32]
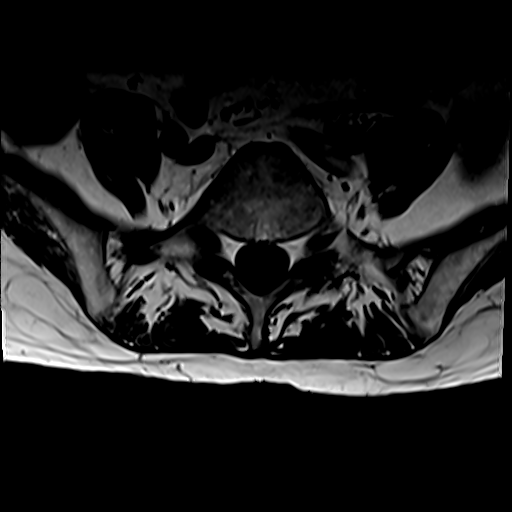
[im 10/32]
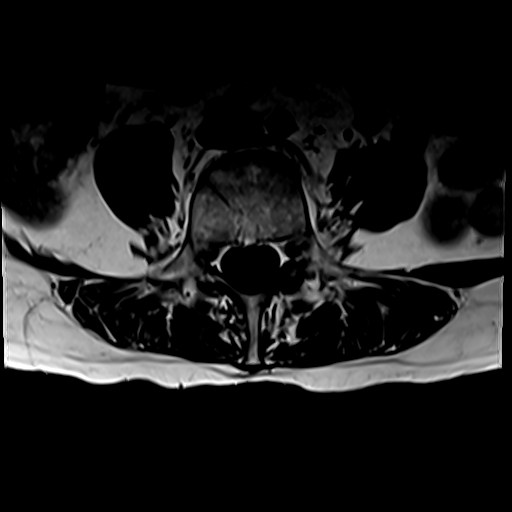
[im 15/32]
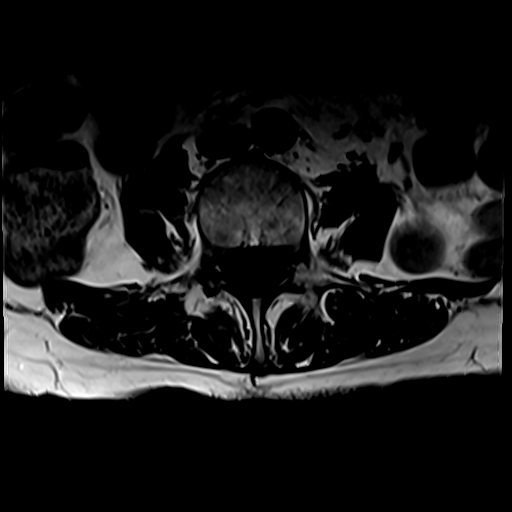
[im 17/32]
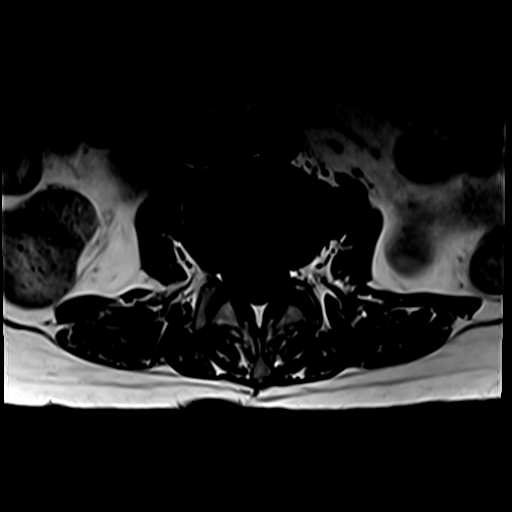
[im 22/32]
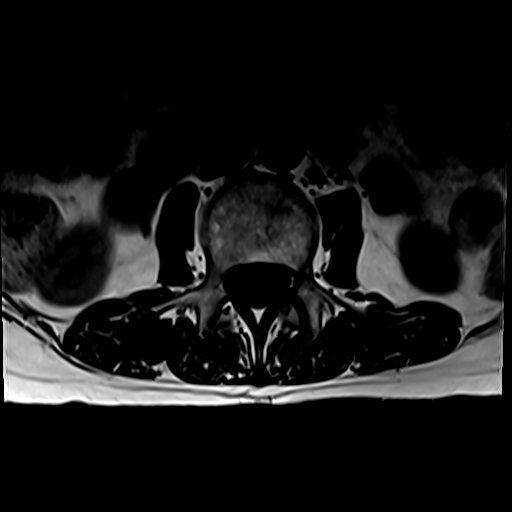
[im 27/32]
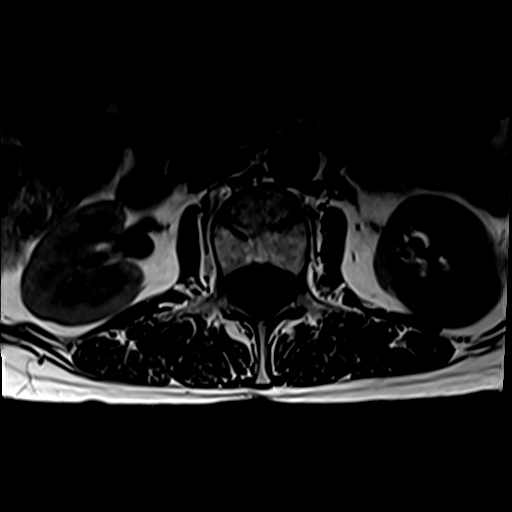
[im 32/32]
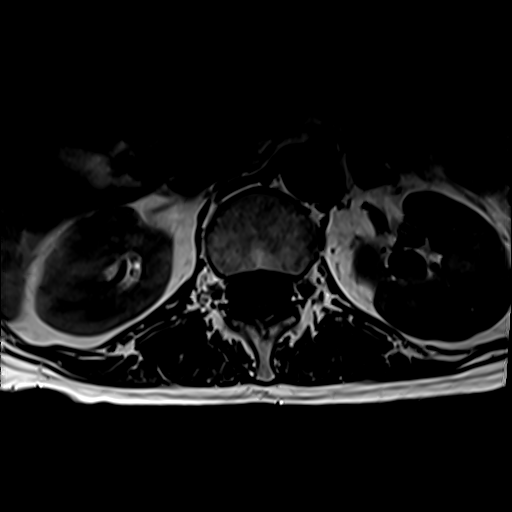

[31 of 48 positions shown; findings below may reference images not displayed]

FINDINGS: Segmentation:  Standard.

Alignment:  No significant anteroposterior listhesis.

Vertebrae: Lumbar vertebral body heights are maintained apart from
degenerative endplate irregularity primarily at L4-L5. Mild
degenerative endplate marrow edema at L5-S1. No suspicious osseous
lesion.

Conus medullaris and cauda equina: Conus extends to the T12-L1
level. Conus and cauda equina appear normal.

Paraspinal and other soft tissues: T2 hyperintense lesions of the
left kidney are incompletely characterized but statistically likely
reflect incidental cyst.

Disc levels:

L1-L2:  No canal or foraminal stenosis.

L2-L3: Disc bulge. Mild facet arthropathy. No canal or foraminal
stenosis.

L3-L4: Disc bulge. Mild facet arthropathy. No canal or foraminal
stenosis.

L4-L5: Disc bulge with endplate osteophytic ridging. Moderate facet
arthropathy. Minor canal stenosis with slight effacement of the
subarticular recesses. No right foraminal stenosis. Mild left
foraminal stenosis.

L5-S1: Disc bulge with superimposed right subarticular disc
extrusion extending below the disc level. Endplate osteophytic
ridging. Moderate facet arthropathy. No canal stenosis. Narrowing of
the right subarticular recess with compression of traversing S1
nerve root. Mild foraminal stenosis, right greater than left.
IMPRESSION: Multilevel degenerative changes as detailed above. Most notably,
there is a disc extrusion at L5-S1 compressing the traversing right
S1 nerve root.

## 2022-05-25 ENCOUNTER — Other Ambulatory Visit: Payer: Self-pay | Admitting: Neurology

## 2022-06-20 DIAGNOSIS — Z0000 Encounter for general adult medical examination without abnormal findings: Secondary | ICD-10-CM | POA: Diagnosis not present

## 2022-06-20 DIAGNOSIS — Z299 Encounter for prophylactic measures, unspecified: Secondary | ICD-10-CM | POA: Diagnosis not present

## 2022-06-20 DIAGNOSIS — I10 Essential (primary) hypertension: Secondary | ICD-10-CM | POA: Diagnosis not present

## 2022-06-20 DIAGNOSIS — Z713 Dietary counseling and surveillance: Secondary | ICD-10-CM | POA: Diagnosis not present

## 2022-06-20 DIAGNOSIS — Z681 Body mass index (BMI) 19 or less, adult: Secondary | ICD-10-CM | POA: Diagnosis not present

## 2022-07-22 NOTE — Progress Notes (Unsigned)
    Assessment/Plan:   1.  Parkinsons Disease  -Continue carbidopa/levodopa 25/100, 1 tablet 3 times per day  -proud of her for all the exercise  2.  Fuchs dystrophy  -following with ophthalmology  3.  Lumbar radiculopathy  -MRI lumbar spine demonstrating L5-S1 radiculopathy, affecting the right S1 nerve root.    -Has followed up with Dr. Vertell Limber in the past.  Has not seen anyone recently as she has been doing good.  4.  Insomnia  -*** mirtazapine, 15 mg q hs.  R/b/se discussed  5.  NSTEMI, 03/2022  -Following with cardiology. Subjective:   Hailey Kelly was seen today in follow up for Parkinsons disease.  Outside records that were made available to me were reviewed.  Patient is doing well from a Parkinson's standpoint.  Last visit, we did start mirtazapine at bedtime for sleep and she reports that ***.however, she was admitted for NSTEMI in October, but then her cardiac catheterization was normal.  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  Current prescribed movement disorder medications: carbidopa/levodopa 25/100, 1 tablet 3 times per day Mirtazapine, 15 mg nightly  Previous meds:  trazodone given but didn't want to take so she d/c it (she took it one time and didn't work)  ALLERGIES:  No Known Allergies  CURRENT MEDICATIONS:  Outpatient Encounter Medications as of 07/24/2022  Medication Sig   carbidopa-levodopa (SINEMET IR) 25-100 MG tablet TAKE 1 TABLET THREE TIMES DAILY   metoprolol succinate (TOPROL-XL) 50 MG 24 hr tablet Take 50 mg by mouth daily. Take with or immediately following a meal.   mirtazapine (REMERON) 15 MG tablet TAKE 1 TABLET AT BEDTIME   naproxen sodium (ALEVE) 220 MG tablet Take 220 mg by mouth as needed.   tiZANidine (ZANAFLEX) 4 MG capsule Take 4 mg by mouth 3 (three) times daily as needed for muscle spasms.   No facility-administered encounter medications on file as of 07/24/2022.    Objective:    PHYSICAL EXAMINATION:    VITALS:   There were no vitals filed for this visit.      GEN:  The patient appears stated age and is in NAD. HEENT:  Normocephalic, atraumatic.  The mucous membranes are moist. The superficial temporal arteries are without ropiness or tenderness. CV:  brady.  regular Lungs:  CTAB Neck/HEME:  There are no carotid bruits bilaterally.  Neurological examination:  Orientation: The patient is alert and oriented x3. Cranial nerves: There is good facial symmetry without facial hypomimia. The speech is fluent and clear. Soft palate rises symmetrically and there is no tongue deviation. Hearing is intact to conversational tone. Sensation: Sensation is intact to light touch throughout Motor: Strength is at least antigravity x4.  Movement examination: Tone: There is nl tone in the ue/le Abnormal movements: no tremor today Coordination:  There is no decremation with RAM's, with any form of RAMS, including alternating supination and pronation of the forearm, hand opening and closing, finger taps, heel taps and toe taps. Gait and Station: The patient has no difficulty arising out of a deep-seated chair without the use of the hands. The patient's stride length is normal.    Total time spent on today's visit was *** minutes, including both face-to-face time and nonface-to-face time.  Time included that spent on review of records (prior notes available to me/labs/imaging if pertinent), discussing treatment and goals, answering patient's questions and coordinating care.   Cc:  Monico Blitz, MD

## 2022-07-24 ENCOUNTER — Ambulatory Visit: Payer: Medicare PPO | Admitting: Neurology

## 2022-07-24 ENCOUNTER — Encounter: Payer: Self-pay | Admitting: Neurology

## 2022-07-24 VITALS — BP 122/62 | HR 51 | Ht 64.0 in | Wt 109.2 lb

## 2022-07-24 DIAGNOSIS — G20A1 Parkinson's disease without dyskinesia, without mention of fluctuations: Secondary | ICD-10-CM | POA: Diagnosis not present

## 2022-07-24 DIAGNOSIS — G20B1 Parkinson's disease with dyskinesia, without mention of fluctuations: Secondary | ICD-10-CM | POA: Diagnosis not present

## 2022-07-24 MED ORDER — CARBIDOPA-LEVODOPA 25-100 MG PO TABS: 1.0000 | ORAL_TABLET | Freq: Three times a day (TID) | ORAL | 2 refills | Status: DC

## 2022-07-24 NOTE — Patient Instructions (Signed)
You look great!!  Local and Online Resources for Power over Parkinson's Group  January 2024   LOCAL [place] PARKINSON'S GROUPS   Power over Parkinson's Group:    Power Over Parkinson's Patient Education Group will be Wednesday, January 10th-*Hybrid meting*- in person at [place] location and via WEBEX, 2:00-3:00 pm.   Starting in November, Power over Parkinson's and Care Partner Groups will meet together, with plans for separate break out session for caregivers (*this will be evolving over the next few months) Upcoming Power over Parkinson's Meetings/Care Partner Support:  2nd Wednesdays of the month at 2 pm:   January 10th, February 14th Contact Amy Marriott at [email or web address] if interested in participating in this group    LOCAL EVENTS AND NEW OFFERINGS  New PWR! Moves Community Fitness Instructor-Led Classes offering at [employer]!  TUESDAYS and Wednesdays 1-2 pm.   Contact Christy Weaver at  [email or web address]  or [phone] (Tuesday classes are modified for chair and standing only) Drumming for Parkinson's will be held on 2nd and 4th Mondays at 11:00 am.   Located at the [employer] ([address] [place].)  Contact Jane Maydian at [email or web address] or [phone]  [employer] Parkinson's Tai Chi Class, Mondays at 11 am.  Call [phone] for details Let's Try Pickleball-$25 for 6 weeks of Pickleball in [place].  Contact Sarah Chambers for more details and for dates.  [email or web address] SAVE THE DATE and REGISTER:  [place] Chapter of Parkinson's Foundation:  Parkinson's Symposium.  Conversations about Parkinson's.  Saturday, August 31, 2022, 9:00 am-2:00 pm.  [facility] [facility] [employer], *In person or online via Zoom*.  Register at [email or web address] or call Diana at [phone].   ONLINE EDUCATION AND SUPPORT  Parkinson Foundation:  www.parkinson.org  PD Health  at Home continues:  Mindfulness Mondays, Wellness Wednesdays, Fitness Fridays   Upcoming Education:    Empower your Voice:  Enhancing Cognitive and Communication Skills. Wednesday, January 3rd,  1-2 pm  Managing Weight Loss & Retaining Muscle Mass.  Wednesday, Jan 10th, 1-2 pm Changes in Speech and Voice.  Wednesday, January 17th, 1-2 pm Register for virtual education and expert briefings (webinars) at www.parkinson.org/resources-support/online-education Please check out their website to sign up for emails and see their full online offerings      [employer] Foundation:  www.michaeljfox.org   Third Thursday Webinars:  On the third Thursday of every month at 12 p.m. ET, join our free live webinars to learn about various aspects of living with Parkinson's disease and our work to speed medical breakthroughs.  Upcoming Webinar:  New Year, New Moves!  Explore Exercise for Life with Parkinson's.  Thursday, January 18th at 12 noon. Check out additional information on their website to see their full online offerings    [employer] Phinney Foundation:  www.davisphinneyfoundation.org  Upcoming Webinar:   Physical Therapy and Parkinson's.  Thursday, January 11th, 2 pm  Webinar Series:  Living with Parkinson's Meetup.   Third Thursdays each month, 3 pm  Care Partner Monthly Meetup.  With Connie Carpenter Phinney.  First Tuesday of each month, 2 pm  Check out additional information to Live Well Today on their website    Parkinson and Movement Disorders (PMD) Alliance:  www.pmdalliance.org  [employer]:  Online Education Events  Sign up for emails, which are sent weekly to give you updates on programming and online offerings    Parkinson's Association of [place]:  www.parkinsonassociation.org  Information on online support groups, education events, and online exercises   including Yoga, Parkinson's exercises and more-LOTS of information on links to PD resources and online events  Virtual Support  Group through Parkinson's Association of the [place]; next one is scheduled for Wednesday, Feb 7th  MOVEMENT AND EXERCISE OPPORTUNITIES  PWR! Moves Classes at [place] [facility].  Wednesdays 10 and 11 am.   Contact Amy Marriott, PT [email or web address] if interested.  NEW PWR! Moves Class offerings at [employer].  *TUESDAYS* and Wednesdays 1-2 pm.    Contact Christy Weaver at  [email or web address]    Parkinson's Wellness Recovery (PWR! Moves)  www.pwr4life.org  Info on the PWR! Virtual Experience:  You will have access to our expertise?through self-assessment, guided plans that start with the PD-specific fundamentals, educational content, tips, Q&A with an expert, and a growing library of PD-specific pre-recorded and live exercise classes of varying types and intensity - both physical and cognitive! If that is not enough, we offer 1:1 wellness consultations (in-person or virtual) to personalize your PWR! Virtual Experience.   Parkinson Foundation Fitness Fridays:   As part of the PD Health @ Home program, this free video series focuses each week on one aspect of fitness designed to support people living with Parkinson's.? These weekly videos highlight the Parkinson Foundation fitness guidelines for people with Parkinson's disease.  www.parkinson.org/resources-support/online-education/pdhealth#ff   Dance for PD website is offering free, live-stream classes throughout the week, as well as links to digital library of classes:  https://danceforparkinsons.org/  Virtual dance and Pilates for Parkinson's classes: Click on the Community Tab> Parkinson's Movement Initiative Tab.  To register for classes and for more information, visit www.americandancefestival.org and click the "community" tab.   [employer] Parkinson's Cycling Classes   [employer]:  Thursdays @ Noon-Live classes at [employer] (Contact Margaret Hazen at [email or web address]?or [phone])  [place]  [employer]: Virtual Classes Mondays and Thursdays /Live classes Tuesday, Wednesday and Thursday (contact Marlee at [email or web address] ?or [phone])  [place] Steady Boxing  Varied levels of classes are offered Tuesdays and Thursdays at [employer].   Stretching with [place] weekly class is also offered for people with Parkinson's  To observe a class or for more information, call [phone] or email Hillary Savage at [email or web address]   ADDITIONAL SUPPORT AND RESOURCES  [employer]:Online Caregiver Education Opportunities:  www.well-springsolutions.org/caregiver-education/caregiver-support-group.  You may also contact Jodi Kolada at [email or web address] or [phone].     [employer] Navigator:  Just1Navigator program, a?free service to help individuals and families through the journey of determining care for older adults.  The "Navigator" is a social worker, Nicole Reynolds, who will speak with a prospective client and/or loved ones to provide an assessment of the situation and a set of recommendations for a personalized care plan -- all free of charge, and whether?[employer] offers the needed service or not. If the need is not a service we provide, we are well-connected with reputable programs in town that we can refer you to.  www.well-springsolutions.org or to speak with the Navigator, call [phone].   

## 2022-07-29 DIAGNOSIS — Z789 Other specified health status: Secondary | ICD-10-CM | POA: Diagnosis not present

## 2022-07-29 DIAGNOSIS — I2510 Atherosclerotic heart disease of native coronary artery without angina pectoris: Secondary | ICD-10-CM | POA: Diagnosis not present

## 2022-07-29 DIAGNOSIS — I10 Essential (primary) hypertension: Secondary | ICD-10-CM | POA: Diagnosis not present

## 2022-07-29 DIAGNOSIS — E785 Hyperlipidemia, unspecified: Secondary | ICD-10-CM | POA: Diagnosis not present

## 2022-10-02 ENCOUNTER — Telehealth: Payer: Self-pay | Admitting: Neurology

## 2022-10-02 NOTE — Telephone Encounter (Signed)
Pt called in to let Vikki Ports know that she called her PCP and she is going to get the referral from them tomorrow.

## 2022-10-02 NOTE — Telephone Encounter (Signed)
Called Patient back and let her know I would have to get approval from Dr. Carles Collet and see if she wants to do an referral for [facility]. I asked patient if she had reached out to PCP for a referral and she told me no she just thought of  Dr. Carles Collet. I did ask her to call them and see if she could get it from them

## 2022-10-02 NOTE — Telephone Encounter (Signed)
Pt called in and left a message with the access nurse. She stated she needs a referral to a different cardiologist. She wants one sent to [facility]

## 2022-10-03 DIAGNOSIS — I252 Old myocardial infarction: Secondary | ICD-10-CM | POA: Diagnosis not present

## 2022-10-03 DIAGNOSIS — M62838 Other muscle spasm: Secondary | ICD-10-CM | POA: Diagnosis not present

## 2022-10-03 DIAGNOSIS — Z299 Encounter for prophylactic measures, unspecified: Secondary | ICD-10-CM | POA: Diagnosis not present

## 2022-10-03 DIAGNOSIS — I10 Essential (primary) hypertension: Secondary | ICD-10-CM | POA: Diagnosis not present

## 2022-10-30 DIAGNOSIS — Z1283 Encounter for screening for malignant neoplasm of skin: Secondary | ICD-10-CM | POA: Diagnosis not present

## 2022-10-30 DIAGNOSIS — D485 Neoplasm of uncertain behavior of skin: Secondary | ICD-10-CM | POA: Diagnosis not present

## 2022-10-30 DIAGNOSIS — L821 Other seborrheic keratosis: Secondary | ICD-10-CM | POA: Diagnosis not present

## 2022-11-05 ENCOUNTER — Ambulatory Visit: Payer: Medicare PPO | Attending: Internal Medicine | Admitting: Internal Medicine

## 2022-11-05 ENCOUNTER — Encounter: Payer: Self-pay | Admitting: Internal Medicine

## 2022-11-05 VITALS — BP 148/70 | HR 62 | Ht 65.0 in | Wt 109.0 lb

## 2022-11-05 DIAGNOSIS — I351 Nonrheumatic aortic (valve) insufficiency: Secondary | ICD-10-CM | POA: Diagnosis not present

## 2022-11-05 DIAGNOSIS — I252 Old myocardial infarction: Secondary | ICD-10-CM | POA: Diagnosis not present

## 2022-11-05 DIAGNOSIS — I10 Essential (primary) hypertension: Secondary | ICD-10-CM | POA: Diagnosis not present

## 2022-11-05 DIAGNOSIS — I429 Cardiomyopathy, unspecified: Secondary | ICD-10-CM | POA: Diagnosis not present

## 2022-11-05 NOTE — Progress Notes (Signed)
Cardiology Office Note  Date: 11/05/2022   ID: [name], DOB Sep 26, 1950, MRN [id number]  PCP:  Kirstie Peri, MD  Cardiologist:  Marjo Bicker, MD Electrophysiologist:  None   Reason for Office Visit: Evaluation of MI at the request of Dr. Sherryll Burger   History of Present Illness: Hailey Kelly is a 72 y.o. female known to have HTN, history of NSTEMI/MINOCA in 03/2022 with LVEF 45 to 50%, mild to moderate AI in 03/2022 was referred to cardiology clinic for evaluation of MI.  Patient had NSTEMI in 03/2022 with peak troponin in 3585. Echocardiogram showed inferolateral wall motion abnormality and LVEF 45 to 50%.  LHC showed angiographically normal coronaries and normal LVEDP. Patient used to follow-up with [facility] cardiology but due to driving distance, she preferred [facility] cardiology.  Has no symptoms of DOE, angina, dizziness, syncope, palpitations and leg swelling.  Compliant with medications and has no side effects.   Past Medical History:  Diagnosis Date   Bursitis    left shoulder   Hypertension     Past Surgical History:  Procedure Laterality Date   Back Surgery for ruptured disc     By Dr. Venetia Maxon on 10/05/20   BREAST ENHANCEMENT SURGERY     BREAST IMPLANT REMOVAL     GANGLION CYST EXCISION     LASIK     TUBAL LIGATION      Current Outpatient Medications  Medication Sig Dispense Refill   carbidopa-levodopa (SINEMET IR) 25-100 MG tablet Take 1 tablet by mouth 3 (three) times daily. 270 tablet 2   Cholecalciferol (VITAMIN D3) 50 MCG (2000 UT) capsule Take 2,000 Units by mouth daily.     losartan (COZAAR) 25 MG tablet Take 12.5 mg by mouth daily.     metoprolol succinate (TOPROL-XL) 50 MG 24 hr tablet Take 50 mg by mouth daily. Take with or immediately following a meal.     naproxen sodium (ALEVE) 220 MG tablet Take 220 mg by mouth as needed.     tiZANidine (ZANAFLEX) 4 MG capsule Take 4 mg by mouth 3 (three) times daily as needed for muscle spasms.     No  current facility-administered medications for this visit.   Allergies:  Patient has no known allergies.   Social History: The patient  reports that she has never smoked. She has never used smokeless tobacco. She reports current alcohol use. She reports that she does not use drugs.   Family History: The patient's family history includes Arthritis in her mother; Hypertension in her brother, father, and mother; Stroke in her sister.   ROS:  Please see the history of present illness. Otherwise, complete review of systems is positive for none  All other systems are reviewed and negative.   Physical Exam: VS:  BP (!) 148/70   Pulse 62   Ht 5\' 5"  (1.651 m)   Wt 109 lb (49.4 kg)   SpO2 96%   BMI 18.14 kg/m , BMI Body mass index is 18.14 kg/m.  Wt Readings from Last 3 Encounters:  11/05/22 109 lb (49.4 kg)  07/24/22 109 lb 3.2 oz (49.5 kg)  01/16/22 108 lb (49 kg)    General: Patient appears comfortable at rest. HEENT: Conjunctiva and lids normal, oropharynx clear with moist mucosa. Neck: Supple, no elevated JVP or carotid bruits, no thyromegaly. Lungs: Clear to auscultation, nonlabored breathing at rest. Cardiac: Regular rate and rhythm, no S3 or significant systolic murmur, no pericardial rub. Abdomen: Soft, nontender, no hepatomegaly, bowel  sounds present, no guarding or rebound. Extremities: No pitting edema, distal pulses 2+. Skin: Warm and dry. Musculoskeletal: No kyphosis. Neuropsychiatric: Alert and oriented x3, affect grossly appropriate.  Recent Labwork: No results found for requested labs within last 365 days.  No results found for: "CHOL", "TRIG", "HDL", "CHOLHDL", "VLDL", "LDLCALC", "LDLDIRECT"  Other Studies Reviewed Today: LHC in 03/2022 Angiography normal coronaries  Echocardiogram in 03/2022 LVEF 45 to 50% Mild to moderate aortic valve regurgitation  Assessment and Plan: Patient is a 72 year old F known to have HTN, history of NSTEMI/MINOCA in 03/2022 with  LVEF 45 to 50%, mild to moderate AI in 03/2022 was referred to cardiology clinic for evaluation of MI.  # History of NSTEMI/MINOCA with LVEF 45 to 50% and angiographically normal coronaries: Continue metoprolol succinate 50 mg once daily and losartan 12.5 mg once daily. She was not able to afford Jardiance according to [facility] documentation.  Will repeat today echocardiogram for improvement in LVEF.  # Mild to moderate aortic valve regurgitation in 03/2022: Monitor with serial echocardiograms every 1 year.  # HTN, controlled: Patient brought log of blood pressure readings from home which ranged between 100-130 m Hg SBP.  Will continue medications as stated above.  I have spent a total of 45 minutes with patient reviewing chart, EKGs, labs and examining patient as well as establishing an assessment and plan that was discussed with the patient.  > 50% of time was spent in direct patient care.    Medication Adjustments/Labs and Tests Ordered: Current medicines are reviewed at length with the patient today.  Concerns regarding medicines are outlined above.   Tests Ordered: Orders Placed This Encounter  Procedures   EKG 12-Lead   Orders Placed This Encounter  Procedures   EKG 12-Lead   ECHOCARDIOGRAM COMPLETE     Medication Changes: No orders of the defined types were placed in this encounter.   Disposition:  Follow up  6 months  Signed [name] Verne Spurr, MD, 11/05/2022 1:43 PM    [facility] at [facility] [address] [place], [place], [place] [address]

## 2022-11-05 NOTE — Patient Instructions (Addendum)
Medication Instructions:  Your physician recommends that you continue on your current medications as directed. Please refer to the Current Medication list given to you today.  Labwork: none  Testing/Procedures: Your physician has requested that you have an echocardiogram. Echocardiography is a painless test that uses sound waves to create images of your heart. It provides your doctor with information about the size and shape of your heart and how well your heart's chambers and valves are working. This procedure takes approximately one hour. There are no restrictions for this procedure. Please do NOT wear cologne, perfume, aftershave, or lotions (deodorant is allowed). Please arrive 15 minutes prior to your appointment time.  Follow-Up: Your physician recommends that you schedule a follow-up appointment in: 6 months  Any Other Special Instructions Will Be Listed Below (If Applicable).  If you need a refill on your cardiac medications before your next appointment, please call your pharmacy. 

## 2022-11-06 ENCOUNTER — Ambulatory Visit: Payer: Medicare PPO | Admitting: Internal Medicine

## 2022-12-13 ENCOUNTER — Ambulatory Visit (HOSPITAL_COMMUNITY)
Admission: RE | Admit: 2022-12-13 | Discharge: 2022-12-13 | Disposition: A | Discharge status: Home / Self Care | Payer: Medicare PPO | Source: Ambulatory Visit | Attending: Internal Medicine | Admitting: Internal Medicine

## 2022-12-13 DIAGNOSIS — I252 Old myocardial infarction: Secondary | ICD-10-CM | POA: Diagnosis not present

## 2022-12-13 DIAGNOSIS — I428 Other cardiomyopathies: Secondary | ICD-10-CM | POA: Diagnosis not present

## 2022-12-13 LAB — ECHOCARDIOGRAM COMPLETE
Area-P 1/2: 3.85 cm2
P 1/2 time: 794 msec
S' Lateral: 2.8 cm

## 2022-12-13 NOTE — Progress Notes (Signed)
*  PRELIMINARY RESULTS* Echocardiogram 2D Echocardiogram has been performed.  Stacey Drain 12/13/2022, 10:13 AM

## 2022-12-15 DIAGNOSIS — Z1212 Encounter for screening for malignant neoplasm of rectum: Secondary | ICD-10-CM | POA: Diagnosis not present

## 2022-12-15 DIAGNOSIS — Z1211 Encounter for screening for malignant neoplasm of colon: Secondary | ICD-10-CM | POA: Diagnosis not present

## 2022-12-17 ENCOUNTER — Telehealth: Payer: Self-pay

## 2022-12-17 NOTE — Telephone Encounter (Signed)
-----   Message from Marjo Bicker, MD sent at 12/17/2022 12:25 PM EDT ----- Echo showed normal pumping function of the heart, mild MR (leakiness across the mitral valve) and mild to moderate AR (leakiness across the aortic valve).  Will need echo surveillance every 1 to 2 years for AR.

## 2022-12-17 NOTE — Telephone Encounter (Signed)
Patient notified and verbalized understanding. Patient had no questions or concerns at this time. PCP copied 

## 2022-12-25 DIAGNOSIS — H40013 Open angle with borderline findings, low risk, bilateral: Secondary | ICD-10-CM | POA: Diagnosis not present

## 2022-12-25 LAB — COLOGUARD: COLOGUARD: NEGATIVE

## 2023-01-07 DIAGNOSIS — Z79899 Other long term (current) drug therapy: Secondary | ICD-10-CM | POA: Diagnosis not present

## 2023-01-07 DIAGNOSIS — Z7189 Other specified counseling: Secondary | ICD-10-CM | POA: Diagnosis not present

## 2023-01-07 DIAGNOSIS — Z1339 Encounter for screening examination for other mental health and behavioral disorders: Secondary | ICD-10-CM | POA: Diagnosis not present

## 2023-01-07 DIAGNOSIS — Z299 Encounter for prophylactic measures, unspecified: Secondary | ICD-10-CM | POA: Diagnosis not present

## 2023-01-07 DIAGNOSIS — E559 Vitamin D deficiency, unspecified: Secondary | ICD-10-CM | POA: Diagnosis not present

## 2023-01-07 DIAGNOSIS — Z0000 Encounter for general adult medical examination without abnormal findings: Secondary | ICD-10-CM | POA: Diagnosis not present

## 2023-01-07 DIAGNOSIS — I10 Essential (primary) hypertension: Secondary | ICD-10-CM | POA: Diagnosis not present

## 2023-01-07 DIAGNOSIS — E7800 Pure hypercholesterolemia, unspecified: Secondary | ICD-10-CM | POA: Diagnosis not present

## 2023-01-07 DIAGNOSIS — R5383 Other fatigue: Secondary | ICD-10-CM | POA: Diagnosis not present

## 2023-01-07 DIAGNOSIS — Z1331 Encounter for screening for depression: Secondary | ICD-10-CM | POA: Diagnosis not present

## 2023-01-07 DIAGNOSIS — G20A1 Parkinson's disease without dyskinesia, without mention of fluctuations: Secondary | ICD-10-CM | POA: Diagnosis not present

## 2023-01-07 DIAGNOSIS — I252 Old myocardial infarction: Secondary | ICD-10-CM | POA: Diagnosis not present

## 2023-01-09 DIAGNOSIS — R21 Rash and other nonspecific skin eruption: Secondary | ICD-10-CM | POA: Diagnosis not present

## 2023-01-09 DIAGNOSIS — W57XXXA Bitten or stung by nonvenomous insect and other nonvenomous arthropods, initial encounter: Secondary | ICD-10-CM | POA: Diagnosis not present

## 2023-01-09 DIAGNOSIS — Z299 Encounter for prophylactic measures, unspecified: Secondary | ICD-10-CM | POA: Diagnosis not present

## 2023-01-09 DIAGNOSIS — S80869A Insect bite (nonvenomous), unspecified lower leg, initial encounter: Secondary | ICD-10-CM | POA: Diagnosis not present

## 2023-01-09 DIAGNOSIS — I10 Essential (primary) hypertension: Secondary | ICD-10-CM | POA: Diagnosis not present

## 2023-01-20 DIAGNOSIS — E2839 Other primary ovarian failure: Secondary | ICD-10-CM | POA: Diagnosis not present

## 2023-01-22 NOTE — Progress Notes (Signed)
Assessment/Plan:   1.  Parkinsons Disease with mild Parkinsons Disease dyskinesia  -Continue carbidopa/levodopa 25/100, 1 tablet 3 times per day  -she has some cramping of the feet legs but not ready for CR levodopa yet.  We will monitor  -she looks great, esp for being dx in 2018!  -We discussed that it used to be thought that levodopa would increase risk of melanoma but now it is believed that Parkinsons itself likely increases risk of melanoma. she is to get regular skin checks.  She is doing this  2.  Fuchs dystrophy  -following with ophthalmology  3.  Lumbar radiculopathy  -MRI lumbar spine demonstrating L5-S1 radiculopathy, affecting the right S1 nerve root.    -Has followed up with Dr. Venetia Maxon in the past.  Has not needed neurosx since then  4.  Insomnia  -resolved for now.  Is taking some restorative naps  5.  NSTEMI, 03/2022  -Following with cardiology.  -On metoprolol and losartan.  Will need to watch that closely, as dysautonomia associated with Parkinson's disease can be quite common. Subjective:   Hailey Kelly was seen today in follow up for Parkinsons disease.  Outside records that were made available to me were reviewed.  Patient is doing well from a Parkinson's standpoint.  She has had no falls.  No lightheadedness or near syncope.  She has been following with cardiology regularly, last being seen on May 7.  Notes are reviewed.  She is doing Tai Chi and riding the spin bike 5 days/week.  She went to dermatology in May and all looked good.  Saw her PCP recently without change in medication.  She is taking some naps during the day but still generally sleeping  at night.  Some cramping of the feet at night   Current prescribed movement disorder medications: carbidopa/levodopa 25/100, 1 tablet 3 times per day   Previous meds:  trazodone given but didn't want to take so she d/c it (she took it one time and didn't work); mirtazapine (15 mg - took x 3 months and d/c as it  didn't help)  ALLERGIES:  No Known Allergies  CURRENT MEDICATIONS:  Outpatient Encounter Medications as of 01/27/2023  Medication Sig   carbidopa-levodopa (SINEMET IR) 25-100 MG tablet Take 1 tablet by mouth 3 (three) times daily.   Cholecalciferol (VITAMIN D3) 50 MCG (2000 UT) capsule Take 2,000 Units by mouth daily.   losartan (COZAAR) 25 MG tablet Take 12.5 mg by mouth daily.   metoprolol succinate (TOPROL-XL) 50 MG 24 hr tablet Take 50 mg by mouth daily. Take with or immediately following a meal.   naproxen sodium (ALEVE) 220 MG tablet Take 220 mg by mouth as needed.   tiZANidine (ZANAFLEX) 4 MG capsule Take 4 mg by mouth 3 (three) times daily as needed for muscle spasms.   No facility-administered encounter medications on file as of 01/27/2023.    Objective:   PHYSICAL EXAMINATION:    VITALS:   Vitals:   01/27/23 0800  BP: 116/72  Pulse: 80  SpO2: 96%  Weight: 104 lb 9.6 oz (47.4 kg)  Height: 5\' 4"  (1.626 m)     GEN:  The patient appears stated age and is in NAD. HEENT:  Normocephalic, atraumatic.  The mucous membranes are moist. The superficial temporal arteries are without ropiness or tenderness. CV:  rrr Lungs:  CTAB Neck/HEME:  There are no carotid bruits bilaterally.  Neurological examination:  Orientation: The patient is alert and oriented x3. Cranial  nerves: There is good facial symmetry without facial hypomimia. The speech is fluent and clear. Soft palate rises symmetrically and there is no tongue deviation. Hearing is intact to conversational tone. Sensation: Sensation is intact to light touch throughout Motor: Strength is at least antigravity x4.  Movement examination: Tone: There is nl tone in the ue/le Abnormal movements: no tremor today; mild dyskinesia esp with distraction Coordination:  There is no decremation with RAM's, with any form of RAMS, including alternating supination and pronation of the forearm, hand opening and closing, finger taps, heel  taps and toe taps. Gait and Station: The patient has no difficulty arising30 out of a deep-seated chair without the use of the hands. The patient's stride length is normal.  Good arm swing  Total time spent on today's visit was 30 minutes, including both face-to-face time and nonface-to-face time.  Time included that spent on review of records (prior notes available to me/labs/imaging if pertinent), discussing treatment and goals, answering patient's questions and coordinating care.   Cc:  Kirstie Peri, MD

## 2023-01-27 ENCOUNTER — Ambulatory Visit: Payer: Medicare PPO | Admitting: Neurology

## 2023-01-27 ENCOUNTER — Encounter: Payer: Self-pay | Admitting: Neurology

## 2023-01-27 VITALS — BP 116/72 | HR 80 | Ht 64.0 in | Wt 104.6 lb

## 2023-01-27 DIAGNOSIS — G20B1 Parkinson's disease with dyskinesia, without mention of fluctuations: Secondary | ICD-10-CM | POA: Diagnosis not present

## 2023-01-27 NOTE — Patient Instructions (Signed)
SAVE THE DATE!  We are planning a Parkinsons Disease educational symposium at [facility] in [place] on October 11.  More details to come!  If you would like to be added to our email list to get further information, email [email or web address].  We hope to see you there!  The physicians and staff at [facility] are committed to providing excellent care. You may receive a survey requesting feedback about your experience at our office. We strive to receive "very good" responses to the survey questions. If you feel that your experience would prevent you from giving the office a "very good " response, please contact our office to try to remedy the situation. We may be reached at [phone]. Thank you for taking the time out of your busy day to complete the survey.

## 2023-02-11 ENCOUNTER — Other Ambulatory Visit: Payer: Self-pay | Admitting: Internal Medicine

## 2023-02-11 DIAGNOSIS — Z1231 Encounter for screening mammogram for malignant neoplasm of breast: Secondary | ICD-10-CM

## 2023-03-11 ENCOUNTER — Inpatient Hospital Stay: Admission: RE | Admit: 2023-03-11 | Payer: Medicare PPO | Source: Ambulatory Visit

## 2023-05-08 ENCOUNTER — Encounter: Payer: Self-pay | Admitting: Internal Medicine

## 2023-05-08 ENCOUNTER — Ambulatory Visit: Payer: Medicare PPO | Attending: Internal Medicine | Admitting: Internal Medicine

## 2023-05-08 VITALS — BP 136/74 | HR 72 | Ht 65.0 in | Wt 107.6 lb

## 2023-05-08 DIAGNOSIS — I351 Nonrheumatic aortic (valve) insufficiency: Secondary | ICD-10-CM

## 2023-05-08 DIAGNOSIS — I5032 Chronic diastolic (congestive) heart failure: Secondary | ICD-10-CM | POA: Diagnosis not present

## 2023-05-08 DIAGNOSIS — I252 Old myocardial infarction: Secondary | ICD-10-CM | POA: Diagnosis not present

## 2023-05-08 MED ORDER — METOPROLOL SUCCINATE ER 25 MG PO TB24: 25.0000 mg | ORAL_TABLET | Freq: Every day | ORAL | 2 refills | Status: DC

## 2023-05-08 MED ORDER — LOSARTAN POTASSIUM 25 MG PO TABS: 12.5000 mg | ORAL_TABLET | Freq: Every day | ORAL | 2 refills | Status: DC

## 2023-05-08 NOTE — Progress Notes (Signed)
Cardiology Office Note  Date: 05/08/2023   ID: Hailey Kelly, DOB 07-21-1950, MRN [id number]  PCP:  Kirstie Peri, MD  Cardiologist:  Marjo Bicker, MD Electrophysiologist:  None    History of Present Illness: Hailey Kelly is a 72 y.o. female known to have HTN, history of NSTEMI/MINOCA in 03/2022 with LVEF 45 to 50% improved to normal in 2024, mild to moderate AI in 2024 is here for follow-up visit.  Patient had NSTEMI in 03/2022 with peak troponin in 3585. Echocardiogram showed inferolateral wall motion abnormality and LVEF 45 to 50%. LHC showed angiographically normal coronaries and normal LVEDP. Patient used to follow-up with [facility] cardiology but due to driving distance, she preferred [facility] cardiology. Patient is here for follow-up visit.  Overall doing great, no symptoms.  Occasionally has chest pressure but no DOE, orthopnea, PND, syncope or palpitations.  She does have positional dizziness.  I reviewed her home BP log that showed blood pressure ranging from 99 to 120 mmHg SBP.  Control blood pressures.  Echo cutting performed 11/2022 showed normal LVEF, mild MR and mild to moderate AR.   Past Medical History:  Diagnosis Date   Bursitis    left shoulder   Hypertension     Past Surgical History:  Procedure Laterality Date   Back Surgery for ruptured disc     By Dr. Venetia Maxon on 10/05/20   BREAST ENHANCEMENT SURGERY     BREAST IMPLANT REMOVAL     GANGLION CYST EXCISION     LASIK     TUBAL LIGATION      Current Outpatient Medications  Medication Sig Dispense Refill   carbidopa-levodopa (SINEMET IR) 25-100 MG tablet Take 1 tablet by mouth 3 (three) times daily. 270 tablet 2   Cholecalciferol (VITAMIN D3) 50 MCG (2000 UT) capsule Take 2,000 Units by mouth daily.     losartan (COZAAR) 25 MG tablet Take 12.5 mg by mouth daily.     metoprolol succinate (TOPROL-XL) 25 MG 24 hr tablet Take 25 mg by mouth daily.     naproxen sodium (ALEVE) 220 MG tablet Take 220 mg  by mouth as needed.     tiZANidine (ZANAFLEX) 4 MG capsule Take 4 mg by mouth 3 (three) times daily as needed for muscle spasms.     No current facility-administered medications for this visit.   Allergies:  Patient has no known allergies.   Social History: The patient  reports that she has never smoked. She has never used smokeless tobacco. She reports current alcohol use. She reports that she does not use drugs.   Family History: The patient's family history includes Arthritis in her mother; Hypertension in her brother, father, and mother; Stroke in her sister.   ROS:  Please see the history of present illness. Otherwise, complete review of systems is positive for none  All other systems are reviewed and negative.   Physical Exam: VS:  BP 136/74 (BP Location: Right Arm, Patient Position: Sitting, Cuff Size: Normal)   Pulse 72   Ht 5\' 5"  (1.651 m)   Wt 107 lb 9.6 oz (48.8 kg)   SpO2 98%   BMI 17.91 kg/m , BMI Body mass index is 17.91 kg/m.  Wt Readings from Last 3 Encounters:  05/08/23 107 lb 9.6 oz (48.8 kg)  01/27/23 104 lb 9.6 oz (47.4 kg)  11/05/22 109 lb (49.4 kg)    General: Patient appears comfortable at rest. HEENT: Conjunctiva and lids normal, oropharynx clear with moist mucosa.  Neck: Supple, no elevated JVP or carotid bruits, no thyromegaly. Lungs: Clear to auscultation, nonlabored breathing at rest. Cardiac: Regular rate and rhythm, no S3 or significant systolic murmur, no pericardial rub. Abdomen: Soft, nontender, no hepatomegaly, bowel sounds present, no guarding or rebound. Extremities: No pitting edema, distal pulses 2+. Skin: Warm and dry. Musculoskeletal: No kyphosis. Neuropsychiatric: Alert and oriented x3, affect grossly appropriate.  Recent Labwork: No results found for requested labs within last 365 days.  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  I reviewed her home BP log that showed BP range from 99 to 120 mmHg SBP. Has positional dizziness but denies having exertional dizziness and syncope.  Informed patient that we might have to cut back on metoprolol or losartan on both if she starts to have symptoms with these blood pressures.  Echo from 6/24 showed normal LVEF, mild MR and mild to moderate AR.  Will repeat echocardiogram in 1 year for AR surveillance.  Does not have symptoms of DOE, orthopnea or PND.  No leg swelling.  Refilled medications.  I spent a total duration 30 minutes during the prior medications, labs, EKG, face-to-face discussion/counseling of her medical condition, pathophysiology, evaluation, management, ordering test and documenting the findings in the note.   Disposition:  Follow up  1 year  Signed Jalene Demo Verne Spurr, MD, 05/08/2023 8:43 AM    [facility] at [facility] [address] [place], [place], [place] [address]

## 2023-05-08 NOTE — Patient Instructions (Addendum)
Medication Instructions:  Your physician recommends that you continue on your current medications as directed. Please refer to the Current Medication list given to you today.   Labwork: None  Testing/Procedures: Your physician has requested that you have an echocardiogram in 1 year before follow-up. Echocardiography is a painless test that uses sound waves to create images of your heart. It provides your doctor with information about the size and shape of your heart and how well your heart's chambers and valves are working. This procedure takes approximately one hour. There are no restrictions for this procedure. Please do NOT wear cologne, perfume, aftershave, or lotions (deodorant is allowed). Please arrive 15 minutes prior to your appointment time.  Please note: We ask at that you not bring children with you during ultrasound (echo/ vascular) testing. Due to room size and safety concerns, children are not allowed in the ultrasound rooms during exams. Our front office staff cannot provide observation of children in our lobby area while testing is being conducted. An adult accompanying a patient to their appointment will only be allowed in the ultrasound room at the discretion of the ultrasound technician under special circumstances. We apologize for any inconvenience.   Follow-Up: Your physician recommends that you schedule a follow-up appointment in: 1 year. You will receive a reminder call in about 8 months reminding you to schedule your appointment. If you don't receive this call, please contact our office.   Any Other Special Instructions Will Be Listed Below (If Applicable).  Thank you for choosing [facility]!      If you need a refill on your cardiac medications before your next appointment, please call your pharmacy.

## 2023-05-17 ENCOUNTER — Other Ambulatory Visit: Payer: Self-pay | Admitting: Neurology

## 2023-05-17 DIAGNOSIS — G20A1 Parkinson's disease without dyskinesia, without mention of fluctuations: Secondary | ICD-10-CM

## 2023-06-05 DIAGNOSIS — Z299 Encounter for prophylactic measures, unspecified: Secondary | ICD-10-CM | POA: Diagnosis not present

## 2023-06-05 DIAGNOSIS — Z681 Body mass index (BMI) 19 or less, adult: Secondary | ICD-10-CM | POA: Diagnosis not present

## 2023-06-05 DIAGNOSIS — I10 Essential (primary) hypertension: Secondary | ICD-10-CM | POA: Diagnosis not present

## 2023-06-05 DIAGNOSIS — G20A1 Parkinson's disease without dyskinesia, without mention of fluctuations: Secondary | ICD-10-CM | POA: Diagnosis not present

## 2023-06-05 DIAGNOSIS — Z0000 Encounter for general adult medical examination without abnormal findings: Secondary | ICD-10-CM | POA: Diagnosis not present

## 2023-07-31 ENCOUNTER — Ambulatory Visit: Payer: Medicare PPO | Admitting: Neurology

## 2023-08-01 ENCOUNTER — Other Ambulatory Visit: Payer: Self-pay | Admitting: Neurology

## 2023-08-01 DIAGNOSIS — G20B1 Parkinson's disease with dyskinesia, without mention of fluctuations: Secondary | ICD-10-CM

## 2023-08-13 NOTE — Progress Notes (Unsigned)
Assessment/Plan:   1.  Parkinsons Disease with mild Parkinsons Disease dyskinesia -diagnosed in 2018  -Continue carbidopa/levodopa 25/100, 1 tablet 3 times per day  -add carbidopa/levodopa 50/200 CR at bed  -We discussed that it used to be thought that levodopa would increase risk of melanoma but now it is believed that Parkinsons itself likely increases risk of melanoma. she is to get regular skin checks.  She is doing this and next visit is 09/2023  2.  Fuchs dystrophy  -following with ophthalmology  3.  Lumbar radiculopathy  -MRI lumbar spine demonstrating L5-S1 radiculopathy, affecting the right S1 nerve root.    -Has followed up with Dr. Venetia Maxon in the past.  Has not needed neurosx since then  4.  Insomnia  -resolved for now.  Is taking some restorative naps  5.  NSTEMI, 03/2022  -Following with cardiology.  -On metoprolol and losartan.  Cardiology aware that her blood pressures have been low and they are watching that and did note that they may need to taper these medications.  6.  Sialorrhea  -This is commonly associated with PD.  We talked about treatments.  The patient is not a candidate for oral anticholinergic therapy because of increased risk of confusion and falls.  We discussed Botox (type A and B) and 1% atropine drops.  We will try to get auth for botox/myobloc. Subjective:   Hailey Kelly was seen today in follow up for Parkinsons disease.  Outside records that were made available to me were reviewed.  Patient is doing well from a Parkinson's standpoint.  Last visit, she was describing some cramping in the feet/legs at night, but was not yet ready for CR levodopa at bedtime.  She reports today that she is ready for the medication.  She has not had falls.  She has had some drooling - started at night but a few times in day as well.  She is exercising 5 days/week.   No lightheadedness or near syncope.  Continues to follow with cardiology regarding her history of NSTEMI.   Last visit was November 7.  Notes are reviewed.  Cardiology noted that her blood pressures have been low and she has been having some positional dizziness.  For now, they left her on metoprolol and losartan.  She does state that this is now 1/2 tab of losarten.    Current prescribed movement disorder medications: carbidopa/levodopa 25/100, 1 tablet 3 times per day   Previous meds:  trazodone given but didn't want to take so she d/c it (she took it one time and didn't work); mirtazapine (15 mg - took x 3 months and d/c as it didn't help)  ALLERGIES:  No Known Allergies  CURRENT MEDICATIONS:  Outpatient Encounter Medications as of 08/15/2023  Medication Sig   carbidopa-levodopa (SINEMET CR) 50-200 MG tablet Take 1 tablet by mouth at bedtime.   carbidopa-levodopa (SINEMET IR) 25-100 MG tablet TAKE 1 TABLET THREE TIMES DAILY   Cholecalciferol (VITAMIN D3) 50 MCG (2000 UT) capsule Take 2,000 Units by mouth daily.   losartan (COZAAR) 25 MG tablet Take 0.5 tablets (12.5 mg total) by mouth daily.   metoprolol succinate (TOPROL-XL) 25 MG 24 hr tablet Take 1 tablet (25 mg total) by mouth daily.   naproxen sodium (ALEVE) 220 MG tablet Take 220 mg by mouth as needed.   tiZANidine (ZANAFLEX) 4 MG capsule Take 4 mg by mouth 3 (three) times daily as needed for muscle spasms.   No facility-administered encounter medications on  file as of 08/15/2023.    Objective:   PHYSICAL EXAMINATION:    VITALS:   Vitals:   08/15/23 0753  BP: 132/80  Pulse: 91  SpO2: 95%  Weight: 108 lb 9.6 oz (49.3 kg)  Height: 5\' 4"  (1.626 m)      GEN:  The patient appears stated age and is in NAD. HEENT:  Normocephalic, atraumatic.  The mucous membranes are moist. The superficial temporal arteries are without ropiness or tenderness. CV:  rrr Lungs:  CTAB Neck/HEME:  There are no carotid bruits bilaterally.  Neurological examination:  Orientation: The patient is alert and oriented x3. Cranial nerves: There is good  facial symmetry without facial hypomimia. The speech is fluent and clear. Soft palate rises symmetrically and there is no tongue deviation. Hearing is intact to conversational tone. Sensation: Sensation is intact to light touch throughout Motor: Strength is at least antigravity x4.  Movement examination: Tone: There is nl tone in the ue/le Abnormal movements: no tremor today; mild dyskinesia of the RUE esp with distraction Coordination:  There is no decremation with RAM's, with any form of RAMS, including alternating supination and pronation of the forearm, hand opening and closing, finger taps, heel taps and toe taps. Gait and Station: The patient has no difficulty arising30 out of a deep-seated chair without the use of the hands. The patient's stride length is normal.  Good arm swing  Total time spent on today's visit was 30 minutes, including both face-to-face time and nonface-to-face time.  Time included that spent on review of records (prior notes available to me/labs/imaging if pertinent), discussing treatment and goals, answering patient's questions and coordinating care.   Cc:  Kirstie Peri, MD

## 2023-08-15 ENCOUNTER — Ambulatory Visit: Payer: Medicare PPO | Admitting: Neurology

## 2023-08-15 VITALS — BP 132/80 | HR 91 | Ht 64.0 in | Wt 108.6 lb

## 2023-08-15 DIAGNOSIS — K117 Disturbances of salivary secretion: Secondary | ICD-10-CM | POA: Diagnosis not present

## 2023-08-15 DIAGNOSIS — G20B1 Parkinson's disease with dyskinesia, without mention of fluctuations: Secondary | ICD-10-CM | POA: Diagnosis not present

## 2023-08-15 MED ORDER — CARBIDOPA-LEVODOPA ER 50-200 MG PO TBCR: 1.0000 | EXTENDED_RELEASE_TABLET | Freq: Every day | ORAL | 1 refills | Status: DC

## 2023-08-15 NOTE — Patient Instructions (Signed)
ADD carbidopa/levodopa 50/200 CR at bedtime We will check on the botox for the drooling.  Call me if we don't call you to schedule  Local and Online Resources for Power over Parkinson's Group?  Febraury 2025 ?  LOCAL [place] PARKINSON'S GROUPS??  Power over Parkinson's Group:???  Upcoming Power over [employer] Meetings/Care Partner Support:? 2nd Wednesdays of the month at 2 pm:  February 12th, March 12th Contact Lynwood Dawley at [email or web address] or Amy Marriott at [email or web address] if interested in participating in this group?  ?  LOCAL EVENTS AND NEW OFFERINGS?  Dance Project Spring 2025:  January 14-May 20, Tuesdays 10-11 am.  All details on website: [email or web address] ACT FITNESS Chair Yoga classes "Train and Gain", Fridays 10 am, [employer].  Contact Gina at [phone].   PWR! Moves [place] class!  Wednesdays at 10 am.  Please contact Lonia Blood, PT at [email or web address] if interested. Health visitor Classes offering at [employer]!? Tuesdays (Chair Yoga)  and Wednesdays (PWR! Moves)  1:00 pm.?? Contact Aldona Lento [phone] or [email or web address] Drumming for Parkinson's will be held on 2nd and 4th Mondays at 11:00 am.?? Located at the [place] of the [place] ([address] [place] ? Contact Albertina Parr at [email or web address] or [phone]?  [employer] Parkinson's Tai Chi Class, Mondays at 11 am.  Call [phone] for details  [facility] at [facility]- [address], [place] Wednesdays- 4:00 - 5:00 PM - specifically for Parkinson's Disease.  Free!  Contact Denny Peon, [place] - [phone] (clinic) or  [phone] (cell) or by email: [email or web address]   ?ONLINE EDUCATION AND SUPPORT?  Parkinson Foundation:? www.parkinson.org?  PD Health at Home continues:? Mindfulness  Mondays, Wellness Wednesdays, Fitness Fridays??  Upcoming Education:??  Learn More, Live Better.  Parkinson's Symposium Scientist, water quality and [place], [place]).   Saturday, February 8th, 9:30-2:00 Staying Connected:  Nurturing Wellness beyond [employer]. Wednesday, February 12th, 1-2 pm Impulse Control Disorders:  Understanding and Managing the Challenges.  Wednesday, February 19th, 1-2 pm Veterans and Parkinson's:  [employer].  Thursday, February 27th, 2:30-4 pm Care Partner Conversations.  Wednesday, March 5th, 1-2 pm Expert Briefing:    Nourishing Wellness-Nutrition for [employer].  Wednesday, March 12th, 1-2 pm. Register for virtual education and Photographer (webinars) at [employer].gl  Please check out their website to sign up for emails and see their full online offerings??  ?  [employer] Foundation:? www.michaeljfox.org??  Third Thursday Webinars:? On the third Thursday of every month at 12 p.m. ET, join our free live webinars to learn about various aspects of living with Parkinson's disease and our work to speed medical breakthroughs.?  Upcoming Webinar:? How Government Policies Impact Parkinson's Research:  Wins and Next Steps.  Thursday, February 20th at 12 noon.  Check out additional information on their website to see their full online offerings?  ?  [employer]:? www.[email or web address].org?  Upcoming Webinar:   Overcoming Hurdles to Exercise.  Tuesday, February 18th at 5 pm Series:? Living with Parkinson's Meetup.?? Third Thursdays each month, 3 pm?  Care Partner Monthly Meetup.? With Jillene Bucks [place].? First Tuesday of each month, 2 pm?  Check out additional information to Live Well Today on their website?  ?  Parkinson and Movement Disorders (PMD) Alliance:? www.pmdalliance.org?  [employer]uroLife Online:? Online Education Events?  Sign up for emails, which are sent weekly to give you updates on programming and online  offerings?  ?  Parkinson's Association of the [place]:? www.parkinsonassociation.org?  Information on online support groups,  education events, and online exercises including Yoga, Parkinson's exercises and more-LOTS of information on links to PD resources and online events?  Virtual Support Group through [employer] of the [place]-First Wednesday of each month at 2 pm   MOVEMENT AND EXERCISE OPPORTUNITIES?  PWR! Moves [place] class continues!  Wednesdays at 10 am.  Please contact Lonia Blood, PT at [email or web address] if interested. Parkinson's Exercise Class offerings at [employer]. Tuesdays (Chair yoga) and Wednesdays (PWR! Moves)  1:00 pm.?  Contact Aldona Lento [phone] or [email or web address]  Parkinson's Wellness Recovery (PWR! Moves)? www.pwr4life.org?  Info on the PWR! Virtual Experience:? You will have access to our expertise?through self-assessment, guided plans that start with the PD-specific fundamentals, educational content, tips, Q&A with an expert, and a growing Engineering geologist of PD-specific pre-recorded and live exercise classes of varying types and intensity - both physical and cognitive! If that is not enough, we offer 1:1 wellness consultations (in-person or virtual) to personalize your PWR! Dance movement psychotherapist.??  Parkinson [employer] Fridays:??  As part of the PD Health @ Home program, this free video series focuses each week on one aspect of fitness designed to support people living with Parkinson's.? These weekly videos highlight the Parkinson Foundation fitness guidelines for people with Parkinson's disease.?  MenusLocal.com.br?  Dance for PD website is offering free, live-stream classes throughout the week, as well as links to [employer] of classes:? https://danceforparkinsons.org/?  Virtual dance and Pilates for Parkinson's classes: Click on the Community Tab> Parkinson's  Movement Initiative Tab.? To register for classes and for more information, visit www.[email or web address].co.za and click the "community" tab.??  [employer] Parkinson's Cycling Classes??  [employer]:? Thursdays @ Noon-Live classes at [employer] ([employer] at [email or web address]?or [phone])?  [employer]: Classes Tuesday, Wednesday and Thursday (contact [place] at [email or web address] ?or [phone])?  [employer]?  Varied levels of classes are offered Tuesdays and Thursdays at [facility].??  Stretching with Byrd Hesselbach weekly class is also offered for people with Parkinson's?  To observe a class or for more information, call [phone] or email Patricia Nettle at [email or web address]?    ADDITIONAL SUPPORT AND RESOURCES?  [employer]:  Chiropractor:? www.well-springsolutions.org/caregiver-education/caregiver-support-group.? You may also contact Loleta Chance at [facility] -spring.org or [phone].????  Well[employer]Spring Navigator:? Just1Navigator program, a?free service to help individuals and families through the journey of determining care for older adults.? The "Navigator" is a Child psychotherapist, Sidney Ace, who will speak with a prospective client and/or loved ones to provide an assessment of the situation and a set of recommendations for a personalized care plan -- all free of charge, and whether?[employer] offers the needed service or not. If the need is not a service we provide, we are well-connected with reputable programs in town that we can refer you to.? www.well-springsolutions.org or to speak with the Navigator, call [phone].?

## 2023-09-22 DIAGNOSIS — Z299 Encounter for prophylactic measures, unspecified: Secondary | ICD-10-CM | POA: Diagnosis not present

## 2023-09-22 DIAGNOSIS — I10 Essential (primary) hypertension: Secondary | ICD-10-CM | POA: Diagnosis not present

## 2023-09-22 DIAGNOSIS — N390 Urinary tract infection, site not specified: Secondary | ICD-10-CM | POA: Diagnosis not present

## 2023-09-22 DIAGNOSIS — G20A1 Parkinson's disease without dyskinesia, without mention of fluctuations: Secondary | ICD-10-CM | POA: Diagnosis not present

## 2023-09-22 DIAGNOSIS — R350 Frequency of micturition: Secondary | ICD-10-CM | POA: Diagnosis not present

## 2023-10-13 ENCOUNTER — Other Ambulatory Visit: Payer: Self-pay | Admitting: Neurology

## 2023-10-13 DIAGNOSIS — G20B1 Parkinson's disease with dyskinesia, without mention of fluctuations: Secondary | ICD-10-CM

## 2023-10-15 DIAGNOSIS — H18513 Endothelial corneal dystrophy, bilateral: Secondary | ICD-10-CM | POA: Diagnosis not present

## 2023-10-15 DIAGNOSIS — H04123 Dry eye syndrome of bilateral lacrimal glands: Secondary | ICD-10-CM | POA: Diagnosis not present

## 2023-10-15 DIAGNOSIS — H2513 Age-related nuclear cataract, bilateral: Secondary | ICD-10-CM | POA: Diagnosis not present

## 2023-10-28 DIAGNOSIS — L821 Other seborrheic keratosis: Secondary | ICD-10-CM | POA: Diagnosis not present

## 2023-10-28 DIAGNOSIS — L814 Other melanin hyperpigmentation: Secondary | ICD-10-CM | POA: Diagnosis not present

## 2023-10-28 DIAGNOSIS — L738 Other specified follicular disorders: Secondary | ICD-10-CM | POA: Diagnosis not present

## 2023-12-13 ENCOUNTER — Other Ambulatory Visit: Payer: Self-pay | Admitting: Internal Medicine

## 2023-12-26 ENCOUNTER — Other Ambulatory Visit: Payer: Self-pay | Admitting: Neurology

## 2023-12-26 DIAGNOSIS — G20B1 Parkinson's disease with dyskinesia, without mention of fluctuations: Secondary | ICD-10-CM

## 2024-01-08 ENCOUNTER — Other Ambulatory Visit: Payer: Self-pay | Admitting: Neurology

## 2024-01-08 DIAGNOSIS — G20B1 Parkinson's disease with dyskinesia, without mention of fluctuations: Secondary | ICD-10-CM

## 2024-01-12 DIAGNOSIS — Z79899 Other long term (current) drug therapy: Secondary | ICD-10-CM | POA: Diagnosis not present

## 2024-01-12 DIAGNOSIS — E559 Vitamin D deficiency, unspecified: Secondary | ICD-10-CM | POA: Diagnosis not present

## 2024-01-12 DIAGNOSIS — Z7189 Other specified counseling: Secondary | ICD-10-CM | POA: Diagnosis not present

## 2024-01-12 DIAGNOSIS — Z299 Encounter for prophylactic measures, unspecified: Secondary | ICD-10-CM | POA: Diagnosis not present

## 2024-01-12 DIAGNOSIS — Z1339 Encounter for screening examination for other mental health and behavioral disorders: Secondary | ICD-10-CM | POA: Diagnosis not present

## 2024-01-12 DIAGNOSIS — Z0000 Encounter for general adult medical examination without abnormal findings: Secondary | ICD-10-CM | POA: Diagnosis not present

## 2024-01-12 DIAGNOSIS — R5383 Other fatigue: Secondary | ICD-10-CM | POA: Diagnosis not present

## 2024-01-12 DIAGNOSIS — E7800 Pure hypercholesterolemia, unspecified: Secondary | ICD-10-CM | POA: Diagnosis not present

## 2024-01-12 DIAGNOSIS — I10 Essential (primary) hypertension: Secondary | ICD-10-CM | POA: Diagnosis not present

## 2024-01-12 DIAGNOSIS — Z1331 Encounter for screening for depression: Secondary | ICD-10-CM | POA: Diagnosis not present

## 2024-02-16 NOTE — Progress Notes (Unsigned)
 Assessment/Plan:   1.  Parkinsons Disease with mild Parkinsons Disease dyskinesia -diagnosed in 2018  -Continue carbidopa /levodopa  25/100, 1 tablet 3 times per day  -continue carbidopa /levodopa  50/200 CR at bed.  Has helped cramping  -We discussed that it used to be thought that levodopa  would increase risk of melanoma but now it is believed that Parkinsons itself likely increases risk of melanoma. she is to get regular skin checks. She is following up  2.  Fuchs dystrophy  -following with ophthalmology  3.  Lumbar radiculopathy  -MRI lumbar spine demonstrating L5-S1 radiculopathy, affecting the right S1 nerve root.    -Has followed up with Dr. Unice in the past.  Has not needed neurosx since then and doing well in that regard  4.  Insomnia  -resolved for now.  Is taking some restorative naps  5.  NSTEMI, 03/2022  -Following with cardiology.  -On metoprolol  and losartan .  Cardiology aware that her blood pressures have been low and they are watching that and did note that they may need to taper these medications.  She is noting more fatigue and is going to talk to to cardiology about whether metoprolol  contributing.    6.  Sialorrhea  -This is commonly associated with PD.  We talked about treatments.  The patient is not a candidate for oral anticholinergic therapy because of increased risk of confusion and falls.  She wants to hold off on xeomin for now.   Subjective:   Hailey Kelly was seen today in follow up for Parkinsons disease.  Outside records that were made available to me were reviewed.  Patient is doing well from a Parkinson's standpoint.  We added CR levodopa  last visit at bedtime for cramping and she reports today that this is helpful.  Reports that tremor seems to be getting a bit worse, esp before AM meds kick in but its generally okay in day unless nervous.  Her writing is a bit worse, esp in the AM but by the afternoon its worse.  She also notes sweating at bed.  She  has had no falls since last visit.  No near syncopal episodes.  We had discussed botox auth previously for drooling but she had to put that off b/c her mom was sick.  She still wants to hold off on those for now.  She is exercising 5 days/week  Current prescribed movement disorder medications: carbidopa /levodopa  25/100, 1 tablet 3 times per day Carbidopa /levodopa  50/200 CR at bedtime (added last visit)  Previous meds:  trazodone  given but didn't want to take so she d/c it (she took it one time and didn't work); mirtazapine  (15 mg - took x 3 months and d/c as it didn't help)  ALLERGIES:  No Known Allergies  CURRENT MEDICATIONS:  Outpatient Encounter Medications as of 02/18/2024  Medication Sig   carbidopa -levodopa  (SINEMET  CR) 50-200 MG tablet TAKE 1 TABLET AT BEDTIME   carbidopa -levodopa  (SINEMET  IR) 25-100 MG tablet TAKE 1 TABLET THREE TIMES DAILY   Cholecalciferol (VITAMIN D3) 50 MCG (2000 UT) capsule Take 2,000 Units by mouth daily.   losartan  (COZAAR ) 25 MG tablet TAKE 1/2 TABLET EVERY DAY   metoprolol  succinate (TOPROL -XL) 25 MG 24 hr tablet TAKE 1 TABLET EVERY DAY   naproxen sodium (ALEVE) 220 MG tablet Take 220 mg by mouth as needed.   tiZANidine (ZANAFLEX) 4 MG capsule Take 4 mg by mouth 3 (three) times daily as needed for muscle spasms.   No facility-administered encounter medications on file as  of 02/18/2024.    Objective:   PHYSICAL EXAMINATION:    VITALS:   Vitals:   02/18/24 0801  BP: 124/72  Pulse: 75  SpO2: 98%  Weight: 111 lb 6.4 oz (50.5 kg)    GEN:  The patient appears stated age and is in NAD. HEENT:  Normocephalic, atraumatic.  The mucous membranes are moist. The superficial temporal arteries are without ropiness or tenderness. CV:  rrr Lungs:  CTAB Neck/HEME:  There are no carotid bruits bilaterally.  Neurological examination:  Orientation: The patient is alert and oriented x3. Cranial nerves: There is good facial symmetry without facial hypomimia. The  speech is fluent and clear. Soft palate rises symmetrically and there is no tongue deviation. Hearing is intact to conversational tone. Sensation: Sensation is intact to light touch throughout Motor: Strength is at least antigravity x4.  Movement examination: Tone: There is nl tone in the ue/le Abnormal movements: rare tremor in the UE b/l; no dyskinesia today Coordination:  There is no decremation with RAM's, with any form of RAMS, including alternating supination and pronation of the forearm, hand opening and closing, finger taps, heel taps and toe taps. Gait and Station: The patient has no difficulty arising out of a deep-seated chair without the use of the hands. The patient's stride length is normal.  Good arm swing  Total time spent on today's visit was 30 minutes, including both face-to-face time and nonface-to-face time.  Time included that spent on review of records (prior notes available to me/labs/imaging if pertinent), discussing treatment and goals, answering patient's questions and coordinating care.   Cc:  Maree Isles, MD

## 2024-02-18 ENCOUNTER — Ambulatory Visit: Payer: Medicare PPO | Admitting: Neurology

## 2024-02-18 ENCOUNTER — Encounter: Payer: Self-pay | Admitting: Neurology

## 2024-02-18 DIAGNOSIS — G20B1 Parkinson's disease with dyskinesia, without mention of fluctuations: Secondary | ICD-10-CM | POA: Diagnosis not present

## 2024-02-18 MED ORDER — CARBIDOPA-LEVODOPA ER 50-200 MG PO TBCR: 1.0000 | EXTENDED_RELEASE_TABLET | Freq: Every day | ORAL | 1 refills | Status: DC

## 2024-02-18 NOTE — Patient Instructions (Signed)
 REGISTER NOW!  We are planning a Parkinsons Disease educational symposium at [facility], [address] [place], [place], [place] [address] on September 19.  We will have a movement disorder physician expert from [employer] coming to speak and a caregiver speaker.  We will have a panel of experts that will show you who you may need on your team of people on your journey with Parkinsons.  I hope to see you there!  Use this QR code to register by scanning it with the camera app on your phone:      Need more help with registration?  Contact [email or web address]

## 2024-02-24 ENCOUNTER — Other Ambulatory Visit: Payer: Self-pay | Admitting: Internal Medicine

## 2024-02-24 DIAGNOSIS — Z1231 Encounter for screening mammogram for malignant neoplasm of breast: Secondary | ICD-10-CM

## 2024-03-02 ENCOUNTER — Ambulatory Visit
Admission: RE | Admit: 2024-03-02 | Discharge: 2024-03-02 | Disposition: A | Discharge status: Home / Self Care | Source: Ambulatory Visit

## 2024-03-02 DIAGNOSIS — Z1231 Encounter for screening mammogram for malignant neoplasm of breast: Secondary | ICD-10-CM | POA: Diagnosis not present

## 2024-03-08 ENCOUNTER — Other Ambulatory Visit: Payer: Self-pay | Admitting: Neurology

## 2024-03-08 DIAGNOSIS — G20B1 Parkinson's disease with dyskinesia, without mention of fluctuations: Secondary | ICD-10-CM

## 2024-04-14 DIAGNOSIS — Z0000 Encounter for general adult medical examination without abnormal findings: Secondary | ICD-10-CM | POA: Diagnosis not present

## 2024-04-14 DIAGNOSIS — G20A1 Parkinson's disease without dyskinesia, without mention of fluctuations: Secondary | ICD-10-CM | POA: Diagnosis not present

## 2024-04-14 DIAGNOSIS — E2839 Other primary ovarian failure: Secondary | ICD-10-CM | POA: Diagnosis not present

## 2024-04-14 DIAGNOSIS — Z299 Encounter for prophylactic measures, unspecified: Secondary | ICD-10-CM | POA: Diagnosis not present

## 2024-04-14 DIAGNOSIS — I10 Essential (primary) hypertension: Secondary | ICD-10-CM | POA: Diagnosis not present

## 2024-05-06 ENCOUNTER — Ambulatory Visit: Payer: Medicare PPO | Attending: Internal Medicine

## 2024-05-06 DIAGNOSIS — I351 Nonrheumatic aortic (valve) insufficiency: Secondary | ICD-10-CM

## 2024-05-06 LAB — ECHOCARDIOGRAM COMPLETE
AR max vel: 2.72 cm2
AV Peak grad: 4.8 mmHg
AV Vena cont: 0.4 cm
Ao pk vel: 1.1 m/s
Area-P 1/2: 3.1 cm2
Calc EF: 60.6 %
P 1/2 time: 465 ms
S' Lateral: 2.8 cm
Single Plane A2C EF: 59.6 %
Single Plane A4C EF: 62.4 %

## 2024-05-09 ENCOUNTER — Other Ambulatory Visit: Payer: Self-pay | Admitting: Internal Medicine

## 2024-05-13 ENCOUNTER — Ambulatory Visit: Payer: Self-pay | Admitting: Internal Medicine

## 2024-05-17 NOTE — Telephone Encounter (Signed)
-----   Message from Vishnu P Mallipeddi sent at 05/13/2024  1:06 PM EST ----- Normal LV function, G1 DD, normal RV function, mild MR, moderate AR, CVP 3 mmHg.  AR unchanged compared to prior echocardiogram. ----- Message ----- From: Interface, [employer] Sent: 05/06/2024   3:56 PM EST To: Vishnu P Mallipeddi, MD

## 2024-05-17 NOTE — Telephone Encounter (Signed)
 Patient called to follow-up on echocardiogram test results.

## 2024-05-17 NOTE — Telephone Encounter (Signed)
 The patient has been notified of the result and verbalized understanding.  All questions (if any) were answered. Hailey Kelly, CMA 05/17/2024 1:21 PM

## 2024-05-21 ENCOUNTER — Other Ambulatory Visit: Payer: Self-pay | Admitting: Neurology

## 2024-05-21 DIAGNOSIS — G20B1 Parkinson's disease with dyskinesia, without mention of fluctuations: Secondary | ICD-10-CM

## 2024-07-07 ENCOUNTER — Encounter: Payer: Self-pay | Admitting: Internal Medicine

## 2024-07-07 ENCOUNTER — Ambulatory Visit: Attending: Internal Medicine | Admitting: Internal Medicine

## 2024-07-07 VITALS — BP 98/60 | HR 81 | Ht 64.5 in | Wt 109.0 lb

## 2024-07-07 DIAGNOSIS — I10 Essential (primary) hypertension: Secondary | ICD-10-CM | POA: Diagnosis not present

## 2024-07-07 DIAGNOSIS — I351 Nonrheumatic aortic (valve) insufficiency: Secondary | ICD-10-CM

## 2024-07-07 MED ORDER — METOPROLOL SUCCINATE ER 25 MG PO TB24: 25.0000 mg | ORAL_TABLET | Freq: Every day | ORAL | 3 refills | Status: AC

## 2024-07-07 NOTE — Patient Instructions (Signed)
 Medication Instructions:  Your physician recommends that you continue on your current medications as directed. Please refer to the Current Medication list given to you today.  Hold Metoprolol  for 1 week and re-evaluate your symptoms, if you still feel tired, then start back taking Metoprolol  as prescribed   Labwork: None  Testing/Procedures: Your physician has requested that you have an echocardiogram in 1 year. Echocardiography is a painless test that uses sound waves to create images of your heart. It provides your doctor with information about the size and shape of your heart and how well your hearts chambers and valves are working. This procedure takes approximately one hour. There are no restrictions for this procedure. Please do NOT wear cologne, perfume, aftershave, or lotions (deodorant is allowed). Please arrive 15 minutes prior to your appointment time.  Please note: We ask at that you not bring children with you during ultrasound (echo/ vascular) testing. Due to room size and safety concerns, children are not allowed in the ultrasound rooms during exams. Our front office staff cannot provide observation of children in our lobby area while testing is being conducted. An adult accompanying a patient to their appointment will only be allowed in the ultrasound room at the discretion of the ultrasound technician under special circumstances. We apologize for any inconvenience.   Follow-Up: Your physician recommends that you schedule a follow-up appointment in: 1 year. You will receive a reminder call in about 8 months reminding you to schedule your appointment. If you don't receive this call, please contact our office.   Any Other Special Instructions Will Be Listed Below (If Applicable). Thank you for choosing [facility]!     If you need a refill on your cardiac medications before your next appointment, please call your pharmacy.

## 2024-07-07 NOTE — Progress Notes (Signed)
 "    Cardiology Office Note  Date: 07/07/2024   ID: Hailey Kelly, DOB [birth year], MRN [id number]  PCP:  Maree Isles, MD  Cardiologist:  Diannah SHAUNNA Maywood, MD Electrophysiologist:  None    History of Present Illness: Hailey Kelly is a 74 y.o. female known to have HTN, history of NSTEMI/MINOCA in 03/2022 with LVEF 45 to 50% improved to normal in 2024, moderate AI, is here for follow-up visit.  Patient had NSTEMI in 03/2022 with peak troponin in 3585. Echocardiogram showed inferolateral wall motion abnormality and LVEF 45 to 50%. LHC showed angiographically normal coronaries and normal LVEDP. Patient used to follow-up with [facility] cardiology but due to driving distance, she preferred [facility] cardiology. Patient is here for follow-up visit.    Echo in November 2025 showed normal LV function, G1 DD, normal RV function, mild MR, moderate AR, CVP 3 mmHg.  Patient denies having any DOE, orthopnea, PND, leg swelling.  Denies any angina, dizziness, syncope, palpitations, leg swelling.  Past Medical History:  Diagnosis Date   Bursitis    left shoulder   Hypertension     Past Surgical History:  Procedure Laterality Date   Back Surgery for ruptured disc     By Dr. Unice on 10/05/20   BREAST ENHANCEMENT SURGERY     BREAST IMPLANT REMOVAL     GANGLION CYST EXCISION     LASIK     TUBAL LIGATION      Current Outpatient Medications  Medication Sig Dispense Refill   carbidopa -levodopa  (SINEMET  CR) 50-200 MG tablet Take 1 tablet by mouth at bedtime. 90 tablet 1   carbidopa -levodopa  (SINEMET  IR) 25-100 MG tablet TAKE 1 TABLET THREE TIMES DAILY 270 tablet 0   Cholecalciferol (VITAMIN D3) 50 MCG (2000 UT) capsule Take 2,000 Units by mouth daily.     losartan  (COZAAR ) 25 MG tablet TAKE 1/2 TABLET EVERY DAY 45 tablet 0   metoprolol  succinate (TOPROL -XL) 25 MG 24 hr tablet TAKE 1 TABLET EVERY DAY 90 tablet 0   naproxen sodium (ALEVE) 220 MG tablet Take 220 mg by mouth as needed.      tiZANidine (ZANAFLEX) 4 MG capsule Take 4 mg by mouth 3 (three) times daily as needed for muscle spasms.     No current facility-administered medications for this visit.   Allergies:  Patient has no known allergies.   Social History: The patient  reports that she has never smoked. She has never used smokeless tobacco. She reports current alcohol  use. She reports that she does not use drugs.   Family History: The patient's family history includes Arthritis in her mother; Hypertension in her brother, father, and mother; Stroke in her sister.   ROS:  Please see the history of present illness. Otherwise, complete review of systems is positive for none  All other systems are reviewed and negative.   Physical Exam: VS:  BP 98/60   Pulse 81   Ht 5' 4.5 (1.638 m)   Wt 109 lb (49.4 kg)   SpO2 96%   BMI 18.42 kg/m , BMI Body mass index is 18.42 kg/m.  Wt Readings from Last 3 Encounters:  07/07/24 109 lb (49.4 kg)  02/18/24 111 lb 6.4 oz (50.5 kg)  08/15/23 108 lb 9.6 oz (49.3 kg)    General: Patient appears comfortable at rest. HEENT: Conjunctiva and lids normal, oropharynx clear with moist mucosa. Neck: Supple, no elevated JVP or carotid bruits, no thyromegaly. Lungs: Clear to auscultation, nonlabored breathing at rest. Cardiac: Regular  rate and rhythm, no S3 or significant systolic murmur, no pericardial rub. Abdomen: Soft, nontender, no hepatomegaly, bowel sounds present, no guarding or rebound. Extremities: No pitting edema, distal pulses 2+. Skin: Warm and dry. Musculoskeletal: No kyphosis. Neuropsychiatric: Alert and oriented x3, affect grossly appropriate.  Recent Labwork: No results found for requested labs within last 365 days.  No results found for: CHOL, TRIG, HDL, CHOLHDL, VLDL, LDLCALC, LDLDIRECT  Other Studies Reviewed Today: LHC in 03/2022 Angiography normal coronaries  Echocardiogram in 03/2022 LVEF 45 to 50% Mild to moderate aortic valve  regurgitation  Assessment and Plan:   MINOCA in 03/2022 with angiographically normal coronaries: No symptoms.  Continue to monitor.  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  Asymptomatic.  Continue GDMT, metoprolol  succinate 25 mg once daily and losartan  12.5 mg once daily.  Moderate AR: Mild to moderate AR in 2024 by echo, moderate AR in 2025 by echo.  Repeat echocardiogram in 1 year.  I spent 30 minutes in reviewing prior medical records, reports, more than 3 labs, discussion and documentation.   Disposition:  Follow up 1 year  Signed Zadin Lange Priya Rorik Vespa, MD, 07/07/2024 9:16 AM    [facility] at [facility] [address] [place], [place], [place] [address]  "

## 2024-07-23 ENCOUNTER — Other Ambulatory Visit: Payer: Self-pay | Admitting: Internal Medicine

## 2024-08-05 ENCOUNTER — Other Ambulatory Visit: Payer: Self-pay | Admitting: Neurology

## 2024-08-05 DIAGNOSIS — G20B1 Parkinson's disease with dyskinesia, without mention of fluctuations: Secondary | ICD-10-CM

## 2024-09-02 ENCOUNTER — Telehealth: Admitting: Neurology

## 2025-07-07 ENCOUNTER — Ambulatory Visit
